# Patient Record
Sex: Male | Born: 1955 | Race: White | Hispanic: Yes | Marital: Married | Smoking: Never smoker
Health system: Southern US, Community
[De-identification: ages and names within clinical notes are randomized; demographics above are authoritative.]

## PROBLEM LIST (undated history)

## (undated) DIAGNOSIS — M1990 Unspecified osteoarthritis, unspecified site: Secondary | ICD-10-CM

## (undated) DIAGNOSIS — K5792 Diverticulitis of intestine, part unspecified, without perforation or abscess without bleeding: Secondary | ICD-10-CM

## (undated) DIAGNOSIS — K5790 Diverticulosis of intestine, part unspecified, without perforation or abscess without bleeding: Secondary | ICD-10-CM

## (undated) DIAGNOSIS — C61 Malignant neoplasm of prostate: Secondary | ICD-10-CM

## (undated) DIAGNOSIS — E785 Hyperlipidemia, unspecified: Secondary | ICD-10-CM

## (undated) HISTORY — DX: Unspecified osteoarthritis, unspecified site: M19.90

## (undated) HISTORY — DX: Hyperlipidemia, unspecified: E78.5

## (undated) HISTORY — PX: EYE SURGERY: SHX253

## (undated) HISTORY — DX: Diverticulitis of intestine, part unspecified, without perforation or abscess without bleeding: K57.92

## (undated) HISTORY — DX: Malignant neoplasm of prostate: C61

## (undated) HISTORY — DX: Diverticulosis of intestine, part unspecified, without perforation or abscess without bleeding: K57.90

## (undated) HISTORY — PX: TONSILLECTOMY: SUR1361

## (undated) HISTORY — PX: REFRACTIVE SURGERY: SHX103

---

## 2007-05-18 HISTORY — PX: CHOLECYSTECTOMY: SHX55

## 2009-05-17 HISTORY — PX: PROSTATECTOMY: SHX69

## 2017-05-17 DIAGNOSIS — I209 Angina pectoris, unspecified: Secondary | ICD-10-CM

## 2017-05-17 HISTORY — DX: Angina pectoris, unspecified: I20.9

## 2019-08-01 ENCOUNTER — Encounter: Payer: Self-pay | Admitting: Family Medicine

## 2019-08-01 ENCOUNTER — Other Ambulatory Visit: Payer: Self-pay

## 2019-08-01 ENCOUNTER — Ambulatory Visit (INDEPENDENT_AMBULATORY_CARE_PROVIDER_SITE_OTHER): Payer: BC Managed Care – PPO | Admitting: Family Medicine

## 2019-08-01 VITALS — BP 138/70 | HR 46 | Temp 98.0°F | Ht 70.0 in | Wt 189.4 lb

## 2019-08-01 DIAGNOSIS — M7521 Bicipital tendinitis, right shoulder: Secondary | ICD-10-CM

## 2019-08-01 DIAGNOSIS — M1990 Unspecified osteoarthritis, unspecified site: Secondary | ICD-10-CM | POA: Insufficient documentation

## 2019-08-01 DIAGNOSIS — C61 Malignant neoplasm of prostate: Secondary | ICD-10-CM | POA: Insufficient documentation

## 2019-08-01 DIAGNOSIS — E785 Hyperlipidemia, unspecified: Secondary | ICD-10-CM | POA: Insufficient documentation

## 2019-08-01 LAB — PSA: PSA: 0.05 ng/mL — ABNORMAL LOW (ref 0.10–4.00)

## 2019-08-01 LAB — TSH: TSH: 1.33 u[IU]/mL (ref 0.35–4.50)

## 2019-08-01 LAB — CBC
HCT: 39.9 % (ref 39.0–52.0)
Hemoglobin: 13.1 g/dL (ref 13.0–17.0)
MCHC: 32.9 g/dL (ref 30.0–36.0)
MCV: 84.8 fl (ref 78.0–100.0)
Platelets: 266 10*3/uL (ref 150.0–400.0)
RBC: 4.71 Mil/uL (ref 4.22–5.81)
RDW: 13.8 % (ref 11.5–15.5)
WBC: 7.5 10*3/uL (ref 4.0–10.5)

## 2019-08-01 LAB — COMPREHENSIVE METABOLIC PANEL
ALT: 11 U/L (ref 0–53)
AST: 16 U/L (ref 0–37)
Albumin: 4.3 g/dL (ref 3.5–5.2)
Alkaline Phosphatase: 96 U/L (ref 39–117)
BUN: 10 mg/dL (ref 6–23)
CO2: 27 mEq/L (ref 19–32)
Calcium: 9.7 mg/dL (ref 8.4–10.5)
Chloride: 105 mEq/L (ref 96–112)
Creatinine, Ser: 0.79 mg/dL (ref 0.40–1.50)
GFR: 98.65 mL/min (ref >60.00)
Glucose, Bld: 99 mg/dL (ref 70–99)
Potassium: 4.6 mEq/L (ref 3.5–5.1)
Sodium: 139 mEq/L (ref 135–145)
Total Bilirubin: 0.3 mg/dL (ref 0.2–1.2)
Total Protein: 7.3 g/dL (ref 6.0–8.3)

## 2019-08-01 LAB — LIPID PANEL
Cholesterol: 223 mg/dL — ABNORMAL HIGH (ref 0–200)
HDL: 48.2 mg/dL (ref >39.00)
LDL Cholesterol: 144 mg/dL — ABNORMAL HIGH (ref 0–99)
NonHDL: 174.66
Total CHOL/HDL Ratio: 5
Triglycerides: 151 mg/dL — ABNORMAL HIGH (ref 0.0–149.0)
VLDL: 30.2 mg/dL (ref 0.0–40.0)

## 2019-08-01 MED ORDER — DICLOFENAC SODIUM 75 MG PO TBEC: 75.0000 mg | DELAYED_RELEASE_TABLET | Freq: Two times a day (BID) | ORAL | 0 refills | Status: DC

## 2019-08-01 NOTE — Assessment & Plan Note (Signed)
Check lipid panel, CBC, C met, TSH.

## 2019-08-01 NOTE — Progress Notes (Signed)
Jackson [name] is a 64 y.o. male who presents today for an office visit.  He is establishing care as a new patient today.  Assessment/Plan:  New/Acute Problems: Right shoulder pain Consistent with bicep tendinitis.  Discussed home exercises and handout was given.  Recommended ice multiple times daily.  Will start diclofenac 75 mg twice daily for the next 1 to 2 weeks.  Discussed reasons return to care.  Chronic Problems Addressed Today: No problem-specific Assessment & Plan notes found for this encounter.     Subjective:  HPI:  Patient has had right shoulder pain for the past several months.  He is currently remodeling his house and thinks he may have overused it.  He is concerned about a possible tendon strain.  He has used Tylenol and over-the-counter patches which is helped.  Symptoms seem to be improving modestly.  Pain is worse with certain motions.  He also has he will of chronic conditions.  He has a history of high cholesterol has been on cholesterol meds in the past.  Is not currently on anything.  He has a history of prostate cancer and had a prostatectomy in 2011.  Last PSA was checked a couple of years ago which was normal.  He also has bilateral hand osteoarthritis.  PMH:  The following were reviewed and entered/updated in epic: Past Medical History:  Diagnosis Date  . Diverticulosis    Patient Active Problem List   Diagnosis Date Noted  . Dyslipidemia 08/01/2019  . Prostate cancer [facility]) s/p prostatectomy 08/01/2019  . Osteoarthritis 08/01/2019   Past Surgical History:  Procedure Laterality Date  . CHOLECYSTECTOMY    . PROSTATECTOMY  2011    Family History  Problem Relation Age of Onset  . Lung cancer Mother   . Diabetes Brother     Medications- reviewed and updated Current Outpatient Medications  Medication Sig Dispense Refill  . diclofenac (VOLTAREN) 75 MG EC tablet Take 1 tablet (75 mg total) by mouth 2 (two) times daily. 30 tablet 0   No current  facility-administered medications for this visit.    Allergies-reviewed and updated Not on File  Social History   Socioeconomic History  . Marital status: Married    Spouse name: Not on file  . Number of children: Not on file  . Years of education: Not on file  . Highest education level: Not on file  Occupational History  . Not on file  Tobacco Use  . Smoking status: Not on file  Substance and Sexual Activity  . Alcohol use: Not on file  . Drug use: Not on file  . Sexual activity: Not on file  Other Topics Concern  . Not on file  Social History Narrative  . Not on file   Social Determinants of Health   Financial Resource Strain:   . Difficulty of Paying Living Expenses:   Food Insecurity:   . Worried About Charity fundraiser in the Last Year:   . Arboriculturist in the Last Year:   Transportation Needs:   . Film/video editor (Medical):   Marland Kitchen Lack of Transportation (Non-Medical):   Physical Activity:   . Days of Exercise per Week:   . Minutes of Exercise per Session:   Stress:   . Feeling of Stress :   Social Connections:   . Frequency of Communication with Friends and Family:   . Frequency of Social Gatherings with Friends and Family:   . Attends Religious Services:   .  Active Member of Clubs or Organizations:   . Attends Archivist Meetings:   Marland Kitchen Marital Status:           Objective:  Physical Exam: BP 138/70   Pulse (!) 46   Temp 98 F (36.7 C) (Temporal)   Ht 5\' 10"  (1.778 m)   Wt 189 lb 6.4 oz (85.9 kg)   SpO2 99%   BMI 27.18 kg/m   Gen: No acute distress, resting comfortably CV: Regular rate and rhythm with no murmurs appreciated Pulm: Normal work of breathing, clear to auscultation bilaterally with no crackles, wheezes, or rhonchi MSK: Right shoulder without deformity.  Biceps tendon palpated with tenderness.  Mild pain with external rotation.  No pain with internal rotation or supraspinatus testing Neuro: Grossly normal, moves  all extremities Psych: Normal affect and thought content      Jackson [name] M. Jerline Pain, MD 08/01/2019 8:03 AM

## 2019-08-01 NOTE — Assessment & Plan Note (Signed)
Should have some improvement with diclofenac.

## 2019-08-01 NOTE — Patient Instructions (Addendum)
It was very nice to see you today!  You have biceps tendonitis.  Please work on the exercises and use ice.  Start the diclofenac.  Take 1 pill twice daily for the next 1 to 2 weeks.  We will check blood work today.  Come back to see me in 1 year for your next checkup with blood work, or sooner if needed.  Take care, Dr Jerline Pain  Please try these tips to maintain a healthy lifestyle:   Eat at least 3 REAL meals and 1-2 snacks per day.  Aim for no more than 5 hours between eating.  If you eat breakfast, please do so within one hour of getting up.    Each meal should contain half fruits/vegetables, one quarter protein, and one quarter carbs (no bigger than a computer mouse)   Cut down on sweet beverages. This includes juice, soda, and sweet tea.     Drink at least 1 glass of water with each meal and aim for at least 8 glasses per day   Exercise at least 150 minutes every week.

## 2019-08-01 NOTE — Assessment & Plan Note (Signed)
Check PSA. ?

## 2019-08-02 NOTE — Progress Notes (Signed)
Please inform patient of the following:  Cholesterol numbers are a bit elevated but everything else is NORMAL. Recommend starting lipitor 40mg  daily to improve cholesterol numbers and lower risk of heart attack and stroke. Please send in for patient if he is willing to start. Regardless, he should continue to work on diet and exercise and we can recheck in a year.  Jackson [name]. Jerline Pain, MD 08/02/2019 1:05 PM

## 2019-08-21 ENCOUNTER — Encounter: Payer: Self-pay | Admitting: Family Medicine

## 2019-08-22 ENCOUNTER — Other Ambulatory Visit: Payer: Self-pay | Admitting: *Deleted

## 2019-08-22 DIAGNOSIS — E785 Hyperlipidemia, unspecified: Secondary | ICD-10-CM

## 2019-08-22 MED ORDER — ATORVASTATIN CALCIUM 40 MG PO TABS: 40.0000 mg | ORAL_TABLET | Freq: Every day | ORAL | 3 refills | Status: DC

## 2019-08-22 NOTE — Telephone Encounter (Signed)
Left message to return call to our office at their convenience.  

## 2019-10-08 ENCOUNTER — Telehealth: Payer: BC Managed Care – PPO | Admitting: Family

## 2019-10-08 ENCOUNTER — Encounter: Payer: Self-pay | Admitting: Family Medicine

## 2019-10-08 ENCOUNTER — Other Ambulatory Visit: Payer: Self-pay | Admitting: *Deleted

## 2019-10-08 DIAGNOSIS — R197 Diarrhea, unspecified: Secondary | ICD-10-CM

## 2019-10-08 DIAGNOSIS — K625 Hemorrhage of anus and rectum: Secondary | ICD-10-CM

## 2019-10-08 NOTE — Progress Notes (Signed)
Based on what you shared with me, I feel your condition warrants further evaluation and I recommend that you be seen for a face to face office visit.  Given your symptoms you need to be seen face to face to rule out a more serious infection.   NOTE: If you entered your credit card information for this eVisit, you will not be charged. You may see a "hold" on your card for the $35 but that hold will drop off and you will not have a charge processed.   If you are having a true medical emergency please call 911.      For an urgent face to face visit, [place] has five urgent care centers for your convenience:      NEW:  [facility] [place] at  Get Driving Directions [id number] [place], [place] [address] . 10 am - 6pm Monday - Friday    [facility]) Get Driving Directions [id number] [address] [place], [place] [address] . 10 am to 8 pm Monday-Friday . 12 pm to 8 pm [facility] at [facility] Get Driving Directions [id number] [place], [place], [place] [address] . 8 am to 8 pm Monday-Friday . 9 am to 6 pm Saturday . 11 am to 6 pm Sunday     [facility] at [facility] Get Driving Directions  [id number] [address], [place] [address] . 8 am to 8 pm Monday-Friday . 8 am to 4 pm [facility] at [place] Get Driving Directions [id number] [place], [place] [address] . 12 pm to 6 pm Monday-Friday      Your e-visit answers were reviewed by a board certified advanced clinical practitioner to complete your personal care plan.  Thank you for using e-Visits.

## 2019-10-08 NOTE — Telephone Encounter (Signed)
Referral placed.

## 2019-10-09 ENCOUNTER — Encounter: Payer: Self-pay | Admitting: Family Medicine

## 2019-10-09 ENCOUNTER — Ambulatory Visit: Payer: BC Managed Care – PPO | Admitting: Family Medicine

## 2019-10-09 ENCOUNTER — Ambulatory Visit (INDEPENDENT_AMBULATORY_CARE_PROVIDER_SITE_OTHER): Payer: BC Managed Care – PPO | Admitting: Family Medicine

## 2019-10-09 ENCOUNTER — Other Ambulatory Visit: Payer: Self-pay

## 2019-10-09 VITALS — BP 102/70 | HR 71 | Temp 98.6°F | Ht 70.0 in | Wt 181.4 lb

## 2019-10-09 DIAGNOSIS — K5790 Diverticulosis of intestine, part unspecified, without perforation or abscess without bleeding: Secondary | ICD-10-CM | POA: Diagnosis not present

## 2019-10-09 DIAGNOSIS — K5792 Diverticulitis of intestine, part unspecified, without perforation or abscess without bleeding: Secondary | ICD-10-CM

## 2019-10-09 MED ORDER — METRONIDAZOLE 500 MG PO TABS: 500.0000 mg | ORAL_TABLET | Freq: Three times a day (TID) | ORAL | 0 refills | Status: DC

## 2019-10-09 MED ORDER — CIPROFLOXACIN HCL 500 MG PO TABS: 500.0000 mg | ORAL_TABLET | Freq: Two times a day (BID) | ORAL | 0 refills | Status: DC

## 2019-10-09 NOTE — Progress Notes (Signed)
   Jackson [name] is a 64 y.o. male who presents today for an office visit.  Assessment/Plan:  Chronic Problems Addressed Today: Diverticulosis Has diverticulitis flare today.  We will start Cipro and Flagyl as this is worked well for him in the past.  His recent rectal bleeding likely due to diverticulosis however has referral to GI pending.  Encourage good oral hydration.  Discussed reasons to return to care.     Subjective:  HPI:  Patient with flareup of diverticulitis for the past few months.  Got much worse yesterday.  He took leftover Cipro and Flagyl from previous flareup.  Had some improvement.  Pain is better today.  Was initially located in left lower quadrant.  He has been having more frequent loose stools as well.  Over last few months he has additionally had more rectal bleeding.  He would like to be referred to GI.  Had a little nausea yesterday but is better today.       Objective:  Physical Exam: BP 102/70 (BP Location: Left Arm, Patient Position: Sitting, Cuff Size: Normal)   Pulse 71   Temp 98.6 F (37 C) (Temporal)   Ht 5\' 10"  (1.778 m)   Wt 181 lb 6.4 oz (82.3 kg)   SpO2 98%   BMI 26.03 kg/m   Gen: No acute distress, resting comfortably CV: Regular rate and rhythm with no murmurs appreciated Pulm: Normal work of breathing, clear to auscultation bilaterally with no crackles, wheezes, or rhonchi GI: Soft, nontender, nondistended. Neuro: Grossly normal, moves all extremities Psych: Normal affect and thought content      Jackson [name] M. Jerline Pain, MD 10/09/2019 2:28 PM

## 2019-10-09 NOTE — Assessment & Plan Note (Signed)
Has diverticulitis flare today.  We will start Cipro and Flagyl as this is worked well for him in the past.  His recent rectal bleeding likely due to diverticulosis however has referral to GI pending.  Encourage good oral hydration.  Discussed reasons to return to care.

## 2019-10-09 NOTE — Patient Instructions (Signed)
It was very nice to see you today!  I will refill your medications today.  Please take at least for 1 week.  If you are not having any improvement after a week or if your symptoms are worsening please let me know.  You should hear from the GI office soon for your referral.  I will see you back next year for your physical, or sooner if needed.  Take care, Dr Jerline Pain  Please try these tips to maintain a healthy lifestyle:   Eat at least 3 REAL meals and 1-2 snacks per day.  Aim for no more than 5 hours between eating.  If you eat breakfast, please do so within one hour of getting up.    Each meal should contain half fruits/vegetables, one quarter protein, and one quarter carbs (no bigger than a computer mouse)   Cut down on sweet beverages. This includes juice, soda, and sweet tea.     Drink at least 1 glass of water with each meal and aim for at least 8 glasses per day   Exercise at least 150 minutes every week.

## 2019-10-11 ENCOUNTER — Encounter: Payer: Self-pay | Admitting: Nurse Practitioner

## 2019-11-02 NOTE — Progress Notes (Signed)
11/06/2019 Knute Mazzuca [id number] 1956-01-04   CHIEF COMPLAINT:  LLQ pain, rectal bleeding   HISTORY OF PRESENT ILLNESS:  Ervey Fallin is a 64 year old male with a past medical history of arthritis, diverticulitis and prostate cancer s/p prostatectomy 2011 (no radiation or chemo). S/P cholecystectomy 2009.  He presents to our office today as referred by Dr. Dimas Chyle for further evaluation for recurrent diverticulitis and rectal bleeding. He was initially diagnosed with diverticulitis 20 years ago when living in [place]. Since then, he reported having flares of diverticulitis every 1 1/2 years. Never had a CTAP to confirm his diagnosis of diverticulitis. His most recent episode of diverticulitis occurred 10/09/2019. At that time, he was evaluated by his PCP Dr. Dimas Chyle.  He also reported having rectal bleeding for 2 months. He reported passing a hard stool in the morning followed by 3 to 4 mores formed BMs. He noticed bright red blood on the stool and TP with the last BM of the day. He was prescribed Cipro 500 mg  p.o. twice daily and Flagyl 5 mg p.o. 3 times daily x  7 days and his rectal bleeding abated and his LLQ pain significantly improved but did not completely resolve. However, he passed a formed BM yesterday without experiencing any LLQ pain. No BM yet today.  He underwent a colonoscopy in 2011 following an episodes of diverticulitis with significant rectal bleeding. No family history of colon cancer. His father had a colon resection secondary to diverticular disease which required a colostomy. He died post op after having the colostomy reversed in his 11's.    CBC Latest Ref Rng & Units 08/01/2019  WBC 4.0 - 10.5 K/uL 7.5  Hemoglobin 13.0 - 17.0 g/dL 13.1  Hematocrit 39 - 52 % 39.9  Platelets 150 - 400 K/uL 266.0    CMP Latest Ref Rng & Units 08/01/2019  Glucose 70 - 99 mg/dL 99  BUN 6 - 23 mg/dL 10  Creatinine 0.40 - 1.50 mg/dL 0.79  Sodium 135 - 145 mEq/L 139  Potassium  3.5 - 5.1 mEq/L 4.6  Chloride 96 - 112 mEq/L 105  CO2 19 - 32 mEq/L 27  Calcium 8.4 - 10.5 mg/dL 9.7  Total Protein 6.0 - 8.3 g/dL 7.3  Total Bilirubin 0.2 - 1.2 mg/dL 0.3  Alkaline Phos 39 - 117 U/L 96  AST 0 - 37 U/L 16  ALT 0 - 53 U/L 11     Past Medical History:  Diagnosis Date  . Arthritis   . Diverticulitis   . Diverticulosis   . HLD (hyperlipidemia)   . Prostate cancer [facility])    Past Surgical History:  Procedure Laterality Date  . CHOLECYSTECTOMY  05/18/2007  . PROSTATECTOMY  2011  . REFRACTIVE SURGERY Bilateral   . TONSILLECTOMY      Social History:  He is married. Retired. He has 2 sons. Nonsmoker. He drinks four  8 ounce cans of Coke daily. No alcohol. No drug use.   Family History: Mother died age 40 lung cancer and depression. Father with history of diverticulosis, surgery with colostomy, reversal surgery died on his 87's from post op  complications.   Brother age 41 with history of MI, overweight and DM II. Sister age  6 with history of drug and alcohol abuse. Maternal grandfather ? Cancer.   No Known Allergies    Outpatient Encounter Medications as of 11/06/2019  Medication Sig  . acetaminophen (TYLENOL) 500 MG tablet Take 1,000 mg by mouth  every 6 (six) hours as needed.  Marland Kitchen aspirin EC 81 MG tablet Take 81 mg by mouth daily.  Marland Kitchen atorvastatin (LIPITOR) 40 MG tablet Take 1 tablet (40 mg total) by mouth daily.  Nyoka Cowden Tea, Camellia sinensis, (GREEN TEA PO) Take by mouth.  . [DISCONTINUED] ciprofloxacin (CIPRO) 500 MG tablet Take 1 tablet (500 mg total) by mouth 2 (two) times daily.  . [DISCONTINUED] diclofenac (VOLTAREN) 75 MG EC tablet Take 1 tablet (75 mg total) by mouth 2 (two) times daily.  . [DISCONTINUED] metroNIDAZOLE (FLAGYL) 500 MG tablet Take 1 tablet (500 mg total) by mouth 3 (three) times daily.   No facility-administered encounter medications on file as of 11/06/2019.     REVIEW OF SYSTEMS:   Gen: Intentionally lost weight. Denies fever, sweats  or chills. CV: Denies chest pain, palpitations or edema. Resp: Denies cough, shortness of breath of hemoptysis.  GI: See HPI.  GU : Denies urinary burning, blood in urine, increased urinary frequency or incontinence. MS: Denies joint pain, muscles aches or weakness. Derm: Denies rash, itchiness, skin lesions or unhealing ulcers. Psych: Denies depression, anxiety, memory loss, suicidal ideation and confusion. Heme: Denies bruising, bleeding. Neuro:  Denies headaches, dizziness or paresthesias. Endo:  Denies any problems with DM, thyroid or adrenal function.  PHYSICAL EXAM: BP 128/78 (BP Location: Left Arm, Patient Position: Sitting, Cuff Size: Normal)   Pulse 64   Ht 5' 9.25" (1.759 m) Comment: height measured without shoes  Wt 181 lb 6 oz (82.3 kg)   BMI 26.59 kg/m  General: Well developed 64 year old male in no acute distress. Head: Normocephalic and atraumatic. Eyes:  Sclerae non-icteric, conjunctive pink. Ears: Normal auditory acuity. Mouth: Dentition intact. No ulcers or lesions.  Neck: Supple, no lymphadenopathy or thyromegaly.  Lungs: Clear bilaterally to auscultation without wheezes, crackles or rhonchi. Heart: Regular rate and rhythm. No murmur, rub or gallop appreciated.  Abdomen: Soft, non distended. Mild LLQ tenderness without rebound or guarding.  LLQ thickened without discrete mass. No hepatosplenomegaly. Normoactive bowel sounds x 4 quadrants.  Rectal: Patient declined rectal exam today.  Musculoskeletal: Symmetrical with no gross deformities. Skin: Warm and dry. No rash or lesions on visible extremities. Extremities: No edema. Neurological: Alert oriented x 4, no focal deficits.  Psychological:  Alert and cooperative. Normal mood and affect.  ASSESSMENT AND PLAN:  19. 64 year old male with a history of recurrent LLQ pain treated for diverticulitis, last treated with Cipro and Flagyl by his PCP 10/09/2019. Mild LLQ tenderness with thickened left lower colon palpated  on exam today.  -CBC, CMP and CRP  -Abdominal/pelvic CT with contrast -Colonoscopy benefits and risks discussed including risk with sedation, risk of bleeding, perforation and infection. Colonoscopy to be scheduled in 4 to 6 weeks if CTAP does not show active diverticulitis.  -Further follow up to be determined after CTAP and colonoscopy completed  -Miralax Q HS PRN, avoid constipation/straining   2. Rectal Bleeding -See plan in # 1 -Patient to call our office if rectal bleeding recurs.   3. History of Prostate s/p prostatectomy in 2011.        CC:  Vivi Barrack, MD

## 2019-11-06 ENCOUNTER — Other Ambulatory Visit (INDEPENDENT_AMBULATORY_CARE_PROVIDER_SITE_OTHER): Payer: BC Managed Care – PPO

## 2019-11-06 ENCOUNTER — Encounter: Payer: Self-pay | Admitting: Nurse Practitioner

## 2019-11-06 ENCOUNTER — Ambulatory Visit (INDEPENDENT_AMBULATORY_CARE_PROVIDER_SITE_OTHER): Payer: BC Managed Care – PPO | Admitting: Nurse Practitioner

## 2019-11-06 VITALS — BP 128/78 | HR 64 | Ht 69.25 in | Wt 181.4 lb

## 2019-11-06 DIAGNOSIS — K625 Hemorrhage of anus and rectum: Secondary | ICD-10-CM

## 2019-11-06 DIAGNOSIS — K5900 Constipation, unspecified: Secondary | ICD-10-CM | POA: Diagnosis not present

## 2019-11-06 DIAGNOSIS — K5732 Diverticulitis of large intestine without perforation or abscess without bleeding: Secondary | ICD-10-CM

## 2019-11-06 DIAGNOSIS — R1032 Left lower quadrant pain: Secondary | ICD-10-CM | POA: Diagnosis not present

## 2019-11-06 LAB — CBC WITH DIFFERENTIAL/PLATELET
Basophils Absolute: 0.1 10*3/uL (ref 0.0–0.1)
Basophils Relative: 1.1 % (ref 0.0–3.0)
Eosinophils Absolute: 0.2 10*3/uL (ref 0.0–0.7)
Eosinophils Relative: 2.6 % (ref 0.0–5.0)
HCT: 37.1 % — ABNORMAL LOW (ref 39.0–52.0)
Hemoglobin: 12.3 g/dL — ABNORMAL LOW (ref 13.0–17.0)
Lymphocytes Relative: 37.4 % (ref 12.0–46.0)
Lymphs Abs: 2.7 10*3/uL (ref 0.7–4.0)
MCHC: 33.2 g/dL (ref 30.0–36.0)
MCV: 85 fl (ref 78.0–100.0)
Monocytes Absolute: 0.4 10*3/uL (ref 0.1–1.0)
Monocytes Relative: 5.9 % (ref 3.0–12.0)
Neutro Abs: 3.8 10*3/uL (ref 1.4–7.7)
Neutrophils Relative %: 53 % (ref 43.0–77.0)
Platelets: 254 10*3/uL (ref 150.0–400.0)
RBC: 4.37 Mil/uL (ref 4.22–5.81)
RDW: 15.3 % (ref 11.5–15.5)
WBC: 7.1 10*3/uL (ref 4.0–10.5)

## 2019-11-06 LAB — COMPREHENSIVE METABOLIC PANEL
ALT: 15 U/L (ref 0–53)
AST: 18 U/L (ref 0–37)
Albumin: 4.6 g/dL (ref 3.5–5.2)
Alkaline Phosphatase: 86 U/L (ref 39–117)
BUN: 12 mg/dL (ref 6–23)
CO2: 29 mEq/L (ref 19–32)
Calcium: 9.7 mg/dL (ref 8.4–10.5)
Chloride: 105 mEq/L (ref 96–112)
Creatinine, Ser: 0.78 mg/dL (ref 0.40–1.50)
GFR: 100.02 mL/min (ref >60.00)
Glucose, Bld: 100 mg/dL — ABNORMAL HIGH (ref 70–99)
Potassium: 4 mEq/L (ref 3.5–5.1)
Sodium: 139 mEq/L (ref 135–145)
Total Bilirubin: 0.3 mg/dL (ref 0.2–1.2)
Total Protein: 7.6 g/dL (ref 6.0–8.3)

## 2019-11-06 LAB — C-REACTIVE PROTEIN: CRP: <1 mg/dL (ref 0.5–20.0)

## 2019-11-06 MED ORDER — CLENPIQ 10-3.5-12 MG-GM -GM/160ML PO SOLN
1.0000 | ORAL | 0 refills | Status: DC
Start: 2019-11-06 — End: 2019-11-30

## 2019-11-06 NOTE — Patient Instructions (Addendum)
If you are age 65 or older, your body mass index should be between 23-30. Your Body mass index is 26.59 kg/m. If this is out of the aforementioned range listed, please consider follow up with your Primary Care Provider.  If you are age 61 or younger, your body mass index should be between 19-25. Your Body mass index is 26.59 kg/m. If this is out of the aformentioned range listed, please consider follow up with your Primary Care Provider.   You have been scheduled for a CT scan of the abdomen and pelvis at  [place] are scheduled on 11/14/2019 at 2pm. You should arrive 15 minutes prior to your appointment time for registration. Please follow the written instructions below on the day of your exam:  WARNING: IF YOU ARE ALLERGIC TO IODINE/X-RAY DYE, PLEASE NOTIFY RADIOLOGY IMMEDIATELY AT [phone]! YOU WILL BE GIVEN A 13 HOUR PREMEDICATION PREP.  1) Do not eat or drink anything after 10:00am (4 hours prior to your test) 2) You have been given 2 bottles of oral contrast to drink. The solution may taste better if refrigerated, but do NOT add ice or any other liquid to this solution. Shake well before drinking.    Drink 1 bottle of contrast @ 12:00pm (2 hours prior to your exam)  Drink 1 bottle of contrast @ 1:00pm (1 hour prior to your exam)  You may take any medications as prescribed with a small amount of water, if necessary. If you take any of the following medications: METFORMIN, GLUCOPHAGE, GLUCOVANCE, AVANDAMET, RIOMET, FORTAMET, [place] MET, JANUMET, GLUMETZA or METAGLIP, you MAY be asked to HOLD this medication 48 hours AFTER the exam.  The purpose of you drinking the oral contrast is to aid in the visualization of your intestinal tract. The contrast solution may cause some diarrhea. Depending on your individual set of symptoms, you may also receive an intravenous injection of x-ray contrast/dye. Plan on being at [facility] for 30 minutes or longer,  depending on the type of exam you are having performed.  This test typically takes 30-45 minutes to complete.  If you have any questions regarding your exam or if you need to reschedule, you may call the CT department at [phone] between the hours of 8:00 am and 5:00 pm, Monday-Friday.  ________________________________________________________________________ Your provider has requested that you go to the basement level for lab work before leaving today. Press "B" on the elevator. The lab is located at the first door on the left as you exit the elevator.  We have sent the following medications to your pharmacy for you to pick up at your convenience: Clenpiq-colon prep  Take Miralax 1 capful mixed in 8 ounces of water at bed time for constipation as tolerated.  Due to recent changes in healthcare laws, you may see the results of your imaging and laboratory studies on MyChart before your provider has had a chance to review them.  We understand that in some cases there may be results that are confusing or concerning to you. Not all laboratory results come back in the same time frame and the provider may be waiting for multiple results in order to interpret others.  Please give [place] 48 hours in order for your provider to thoroughly review all the results before contacting the office for clarification of your results.   Thank you for choosing [facility] Noralyn Pick, CRNP

## 2019-11-07 NOTE — Progress Notes (Signed)
Agree with the assessment and plan as outlined by Colleen Kennedy-Smith, NP.   Esta Carmon, DO, FACG [facility]   

## 2019-11-14 ENCOUNTER — Encounter (HOSPITAL_COMMUNITY): Payer: Self-pay

## 2019-11-14 ENCOUNTER — Ambulatory Visit (HOSPITAL_COMMUNITY)
Admission: RE | Admit: 2019-11-14 | Discharge: 2019-11-14 | Disposition: A | Discharge status: Home / Self Care | Payer: BC Managed Care – PPO | Source: Ambulatory Visit | Attending: Nurse Practitioner | Admitting: Nurse Practitioner

## 2019-11-14 ENCOUNTER — Other Ambulatory Visit: Payer: Self-pay

## 2019-11-14 DIAGNOSIS — R1032 Left lower quadrant pain: Secondary | ICD-10-CM | POA: Diagnosis not present

## 2019-11-14 DIAGNOSIS — K625 Hemorrhage of anus and rectum: Secondary | ICD-10-CM | POA: Insufficient documentation

## 2019-11-14 DIAGNOSIS — K5900 Constipation, unspecified: Secondary | ICD-10-CM | POA: Diagnosis present

## 2019-11-14 DIAGNOSIS — K5732 Diverticulitis of large intestine without perforation or abscess without bleeding: Secondary | ICD-10-CM | POA: Diagnosis present

## 2019-11-14 MED ORDER — IOHEXOL 300 MG/ML  SOLN
100.0000 mL | Freq: Once | INTRAMUSCULAR | Status: AC | PRN
  Administered 2019-11-14: 100 mL via INTRAVENOUS

## 2019-11-14 MED ORDER — SODIUM CHLORIDE (PF) 0.9 % IJ SOLN
INTRAMUSCULAR | Status: AC
  Filled 2019-11-14: qty 50

## 2019-11-15 ENCOUNTER — Other Ambulatory Visit: Payer: Self-pay | Admitting: Nurse Practitioner

## 2019-11-15 ENCOUNTER — Telehealth: Payer: Self-pay | Admitting: Nurse Practitioner

## 2019-11-15 MED ORDER — METRONIDAZOLE 500 MG PO TABS
500.0000 mg | ORAL_TABLET | Freq: Two times a day (BID) | ORAL | 0 refills | Status: AC
Start: 2019-11-15 — End: 2019-11-29

## 2019-11-15 MED ORDER — CIPROFLOXACIN HCL 500 MG PO TABS
500.0000 mg | ORAL_TABLET | Freq: Two times a day (BID) | ORAL | 0 refills | Status: AC
Start: 2019-11-15 — End: 2019-11-29

## 2019-11-15 NOTE — Telephone Encounter (Signed)
Provider is aware. Thanked [facility] for bringing this to our attention.

## 2019-11-15 NOTE — Telephone Encounter (Signed)
Jackson [name] at [facility] imaging called to provide imaging results. She stated to please call them back even if we received the report.

## 2019-11-26 NOTE — Telephone Encounter (Signed)
Beth looks like I have a 3pm on 7/14, if still open see if patient can take that spot. Thx

## 2019-11-30 ENCOUNTER — Ambulatory Visit (INDEPENDENT_AMBULATORY_CARE_PROVIDER_SITE_OTHER): Payer: BC Managed Care – PPO | Admitting: Nurse Practitioner

## 2019-11-30 ENCOUNTER — Encounter: Payer: Self-pay | Admitting: Nurse Practitioner

## 2019-11-30 ENCOUNTER — Telehealth: Payer: Self-pay | Admitting: Nurse Practitioner

## 2019-11-30 DIAGNOSIS — K5720 Diverticulitis of large intestine with perforation and abscess without bleeding: Secondary | ICD-10-CM | POA: Insufficient documentation

## 2019-11-30 NOTE — Telephone Encounter (Signed)
Jackson [name], can you contact the patient on Monday 7/19 and let him know I would like him to have repeat CBC with iron, iron saturation, TIBC, ferritin, B12 and folate levels done in 2 weeks. Pls provide him with the lab order.

## 2019-11-30 NOTE — Progress Notes (Signed)
11/30/2019 Jackson [name] [id number] 03-03-1956   Chief Complaint: Follow up diverticulitis with perforation   History of Present Illness: Jackson [name] is a 64 year old male with a past medical history of arthritis, diverticulitis and prostate cancer s/p prostatectomy 2011 (no radiation or chemo). S/P cholecystectomy 2009.  Initially saw the patient in office on 11/06/2019 for further evaluation regarding recurrent diverticulitis. He was initially diagnosed with diverticulitis 20 years ago when living in [place]. Since then, he reported having flares of diverticulitis every 1 1/2 years. Never had a CTAP to confirm his diagnosis of diverticulitis.  He developed left lower quadrant abdominal pain and he was treated for presumed diverticulitis by his PCP 10/09/2019.  At that time, He was prescribed Cipro 500 mg  p.o. twice daily and Flagyl 5 mg p.o. 3 times daily x  7 days and his rectal bleeding abated and his LLQ pain significantly improved but did not completely resolve.  When I saw him in the office on 6/22 he had mild left lower quadrant tenderness and the colon felt thickened.  He underwent an abdominal/pelvic CT scan 11/15/2019 which identified a 2.0 x 2.3 x 2.1 cm chronic abscess with a probable contained perforation to the proximal to mid sigmoid colon most likely sequelae of prior diverticulitis. See detailed CT report below.  He was prescribed Cipro 500 mg 1 p.o. twice daily and Flagyl 500 mg 1 p.o. twice daily for 14 days.  His last dose of these antibiotics were taken yesterday.  He presents today for further evaluation.  His left lower quadrant abdominal pain resolved 3 to 4 days after taking the Cipro and Flagyl.  He denies having any further lower abdominal pain.  He is passing a normal brown formed bowel movement easily once daily.  He is taking a stool softener daily.  No recent rectal bleeding.  He is urinating without difficulty.  His appetite is good.  His weight is stable.  No other  complaints at this time.  CBC Latest Ref Rng & Units 11/06/2019 08/01/2019  WBC 4.0 - 10.5 K/uL 7.1 7.5  Hemoglobin 13.0 - 17.0 g/dL 12.3(L) 13.1  Hematocrit 39 - 52 % 37.1(L) 39.9  Platelets 150 - 400 K/uL 254.0 266.0    CMP Latest Ref Rng & Units 11/06/2019 08/01/2019  Glucose 70 - 99 mg/dL 100(H) 99  BUN 6 - 23 mg/dL 12 10  Creatinine 0.40 - 1.50 mg/dL 0.78 0.79  Sodium 135 - 145 mEq/L 139 139  Potassium 3.5 - 5.1 mEq/L 4.0 4.6  Chloride 96 - 112 mEq/L 105 105  CO2 19 - 32 mEq/L 29 27  Calcium 8.4 - 10.5 mg/dL 9.7 9.7  Total Protein 6.0 - 8.3 g/dL 7.6 7.3  Total Bilirubin 0.2 - 1.2 mg/dL 0.3 0.3  Alkaline Phos 39 - 117 U/L 86 96  AST 0 - 37 U/L 18 16  ALT 0 - 53 U/L 15 11    Abdominal/pelvic CT scan 11/15/2019: 1. Diffuse diverticular change in the colon, most advanced in the descending and sigmoid segments. There is some subtle edema/inflammation associated with the distal sigmoid segment and immediately cranial to the proximal to mid sigmoid colon is a 2.0 x 2.3 x 2.1 cm irregular soft tissue structure containing several tiny gas locules. Imaging features are most likely sequelae of age-indeterminate diverticulitis. I do not think this is a dominant irregular diverticulum, but is more likely either a chronic abscess pocket or acute focal area of pericolonic inflammation. There is no  fluid in this structure and there is no percutaneously drainable abscess at this time. Additionally, correlation with colorectal cancer screening history recommended. 2. Tiny hypoattenuating liver lesions, too small to characterize, but likely benign. 3. Prostatectomy. 4. Aortic Atherosclerosis    Current Medications, Allergies, Past Medical History, Past Surgical History, Family History and Social History were reviewed in [employer] record.   Physical Exam: BP 116/62   Pulse (!) 57   Ht 5\' 9"  (1.753 m)   Wt 178 lb 8 oz (81 kg)   BMI 26.36 kg/m  General: Well  developed 64 year old male in no acute distress. Head: Normocephalic and atraumatic. Eyes: No scleral icterus. Conjunctiva pink . Lungs: Clear throughout to auscultation. Heart: Regular rate and rhythm, no murmur. Abdomen: Soft, nontender and nondistended. No masses or hepatomegaly. Normal bowel sounds x 4 quadrants.  Rectal: Deferred.  Musculoskeletal: Symmetrical with no gross deformities. Extremities: No edema. Neurological: Alert oriented x 4. No focal deficits.  Psychological: Alert and cooperative. Normal mood and affect  Assessment and Recommendations:  76.  64 year old male with recurrent left-sided diverticulitis. An abdominal/pelvic CT scan 11/15/2019 identified a 2.0 x 2.3 x 2.1 cm chronic abscess with a probable contained perforation to the proximal to mid sigmoid colon most likely sequelae of prior diverticulitis.  Treated with Cipro 500 mg p.o. twice daily and Flagyl 500 mg 1 p.o. twice daily for 14 days.  His left lower quadrant abdominal pain has resolved. -Colonoscopy with Dr. Bryan Lemma was rescheduled to 01/15/2020. -Patient will call our office if his abdominal pain recurs -Patient should carry a prescription for Cipro and Flagyl during times of travel -Stool softener of choice or MiraLAX, avoid constipation/straining -Pus fluids, resume normal diet -Further follow-up to be determined after colonoscopy completed  2. Normocytic anemia -CBC, iron, iron saturation, TIBC, Ferritin, B12 and folate level. -Patient will require EGD in labs show iron deficiency   3. History of prostate cancer.

## 2019-11-30 NOTE — Patient Instructions (Addendum)
If you are age 65 or older, your body mass index should be between 23-30. Your Body mass index is 26.36 kg/m. If this is out of the aforementioned range listed, please consider follow up with your Primary Care Provider.  If you are age 29 or younger, your body mass index should be between 19-25. Your Body mass index is 26.36 kg/m. If this is out of the aformentioned range listed, please consider follow up with your Primary Care Provider.   1. Call our office if you abdominal pain recurs  2. Avoid constipation. Take a stool softener or Miralax once daily as tolerated to avoid constipation/straining.   Changed date for Colonoscopy to 01/15/2020 @ 10:30am. New instructions for procedure sent via my chart.  Due to recent changes in healthcare laws, you may see the results of your imaging and laboratory studies on MyChart before your provider has had a chance to review them.  We understand that in some cases there may be results that are confusing or concerning to you. Not all laboratory results come back in the same time frame and the provider may be waiting for multiple results in order to interpret others.  Please give [place] 48 hours in order for your provider to thoroughly review all the results before contacting the office for clarification of your results.   Thank you for choosing [facility] Noralyn Pick, CRNP

## 2019-12-03 ENCOUNTER — Other Ambulatory Visit: Payer: Self-pay

## 2019-12-03 DIAGNOSIS — D519 Vitamin B12 deficiency anemia, unspecified: Secondary | ICD-10-CM

## 2019-12-03 DIAGNOSIS — D649 Anemia, unspecified: Secondary | ICD-10-CM

## 2019-12-03 NOTE — Telephone Encounter (Signed)
Contacted the patient. He agrees to come to [facility] for non-fasting labs on 12/17/19.

## 2019-12-07 NOTE — Progress Notes (Signed)
Agree with the assessment and plan as outlined by Colleen Kennedy-Smith, NP.   Byrdie Miyazaki, DO, FACG [facility]   

## 2019-12-17 ENCOUNTER — Other Ambulatory Visit (INDEPENDENT_AMBULATORY_CARE_PROVIDER_SITE_OTHER): Payer: BC Managed Care – PPO

## 2019-12-17 DIAGNOSIS — D649 Anemia, unspecified: Secondary | ICD-10-CM | POA: Diagnosis not present

## 2019-12-17 DIAGNOSIS — D519 Vitamin B12 deficiency anemia, unspecified: Secondary | ICD-10-CM | POA: Diagnosis not present

## 2019-12-17 LAB — CBC WITH DIFFERENTIAL/PLATELET
Basophils Absolute: 0.1 10*3/uL (ref 0.0–0.1)
Basophils Relative: 0.9 % (ref 0.0–3.0)
Eosinophils Absolute: 0.2 10*3/uL (ref 0.0–0.7)
Eosinophils Relative: 2.8 % (ref 0.0–5.0)
HCT: 37.5 % — ABNORMAL LOW (ref 39.0–52.0)
Hemoglobin: 12.2 g/dL — ABNORMAL LOW (ref 13.0–17.0)
Lymphocytes Relative: 31.7 % (ref 12.0–46.0)
Lymphs Abs: 2.7 10*3/uL (ref 0.7–4.0)
MCHC: 32.5 g/dL (ref 30.0–36.0)
MCV: 85.7 fl (ref 78.0–100.0)
Monocytes Absolute: 0.4 10*3/uL (ref 0.1–1.0)
Monocytes Relative: 5.3 % (ref 3.0–12.0)
Neutro Abs: 5 10*3/uL (ref 1.4–7.7)
Neutrophils Relative %: 59.3 % (ref 43.0–77.0)
Platelets: 238 10*3/uL (ref 150.0–400.0)
RBC: 4.38 Mil/uL (ref 4.22–5.81)
RDW: 14 % (ref 11.5–15.5)
WBC: 8.4 10*3/uL (ref 4.0–10.5)

## 2019-12-17 LAB — B12 AND FOLATE PANEL
Folate: >24.8 ng/mL (ref >5.9)
Vitamin B-12: 302 pg/mL (ref 211–911)

## 2019-12-18 LAB — IRON,TIBC AND FERRITIN PANEL
%SAT: 24 % (calc) (ref 20–48)
Ferritin: 129 ng/mL (ref 24–380)
Iron: 75 ug/dL (ref 50–180)
TIBC: 311 mcg/dL (calc) (ref 250–425)

## 2019-12-19 ENCOUNTER — Encounter: Payer: BC Managed Care – PPO | Admitting: Gastroenterology

## 2020-01-10 ENCOUNTER — Encounter: Payer: Self-pay | Admitting: Gastroenterology

## 2020-01-15 ENCOUNTER — Ambulatory Visit (AMBULATORY_SURGERY_CENTER): Payer: BC Managed Care – PPO | Admitting: Gastroenterology

## 2020-01-15 ENCOUNTER — Encounter: Payer: Self-pay | Admitting: Gastroenterology

## 2020-01-15 ENCOUNTER — Other Ambulatory Visit: Payer: Self-pay

## 2020-01-15 VITALS — BP 165/66 | HR 60 | Temp 97.8°F | Resp 14 | Ht 69.0 in | Wt 178.0 lb

## 2020-01-15 DIAGNOSIS — K640 First degree hemorrhoids: Secondary | ICD-10-CM

## 2020-01-15 DIAGNOSIS — K6389 Other specified diseases of intestine: Secondary | ICD-10-CM | POA: Diagnosis not present

## 2020-01-15 DIAGNOSIS — K5720 Diverticulitis of large intestine with perforation and abscess without bleeding: Secondary | ICD-10-CM

## 2020-01-15 DIAGNOSIS — K5730 Diverticulosis of large intestine without perforation or abscess without bleeding: Secondary | ICD-10-CM

## 2020-01-15 MED ORDER — SODIUM CHLORIDE 0.9 % IV SOLN
500.0000 mL | Freq: Once | INTRAVENOUS | Status: DC
Start: 2020-01-15 — End: 2020-01-15

## 2020-01-15 NOTE — Op Note (Signed)
[facility] Patient Name: Jackson [name] Procedure Date: 01/15/2020 10:51 AM MRN: [id number] Endoscopist: Gerrit Heck , MD Age: 64 Referring MD:  Date of Birth: 01/27/1956 Gender: Male Account #: [id number] Procedure:                Colonoscopy Indications:              Abdominal pain in the left lower quadrant, ,                            Abnormal CT of the GI tract, Follow-up of                            diverticulitis,                           64 yo male with history of diverticulosis with                            recurrent diverticulitis, including episode in                            09/2019 treated with PO antibiotics. Had CT in                            11/2019 which demonstrated diverticulitis with                            chronic abscess. Good response to another round of                            PO antibiotics. Medicines:                Monitored Anesthesia Care Procedure:                Pre-Anesthesia Assessment:                           - Prior to the procedure, a History and Physical                            was performed, and patient medications and                            allergies were reviewed. The patient's tolerance of                            previous anesthesia was also reviewed. The risks                            and benefits of the procedure and the sedation                            options and risks were discussed with the patient.  All questions were answered, and informed consent                            was obtained. Prior Anticoagulants: The patient has                            taken no previous anticoagulant or antiplatelet                            agents. ASA Grade Assessment: II - A patient with                            mild systemic disease. After reviewing the risks                            and benefits, the patient was deemed in                            satisfactory condition to  undergo the procedure.                           After obtaining informed consent, the colonoscope                            was passed under direct vision. Throughout the                            procedure, the patient's blood pressure, pulse, and                            oxygen saturations were monitored continuously. The                            Colonoscope was introduced through the anus and                            advanced to the the terminal ileum. The colonoscopy                            was performed without difficulty. The patient                            tolerated the procedure well. The quality of the                            bowel preparation was good. The terminal ileum,                            ileocecal valve, appendiceal orifice, and rectum                            were photographed. Scope In: 11:07:35 AM Scope Out: 11:24:44 AM Scope Withdrawal Time: 0 hours 14 minutes 14 seconds  Total Procedure Duration: 0  hours 17 minutes 9 seconds  Findings:                 The perianal and digital rectal examinations were                            normal.                           Multiple small and large-mouthed diverticula were                            found in the entire colon. This was most dense in                            the sigmoid colon. There was an area of mucosal                            edema and erythema in the sigmoid colon from 20-35                            cm from the anal verge, with the most pronounced                            segment of inflammation from 20-25 cm. Several                            mucosal biopsies were taken with a cold forceps for                            histology. Estimated blood loss was minimal. The                            mucosa in the remainder of the colon was otherwise                            normal appearing.                           Non-bleeding internal hemorrhoids were found during                             retroflexion. The hemorrhoids were small.                           The terminal ileum appeared normal. Complications:            No immediate complications. Estimated Blood Loss:     Estimated blood loss was minimal. Impression:               - Diverticulosis in the entire examined colon.                            Biopsied.                           -  Non-bleeding internal hemorrhoids.                           - The examined portion of the ileum was normal. Recommendation:           - Patient has a contact number available for                            emergencies. The signs and symptoms of potential                            delayed complications were discussed with the                            patient. Return to normal activities tomorrow.                            Written discharge instructions were provided to the                            patient.                           - Resume previous diet.                           - Continue present medications.                           - Await pathology results.                           - Refer to a colo-rectal surgeon at appointment to                            be scheduled to discuss possible elective segmental                            resection for recurrent diverticulitis.                           - Follow-up with Carl Best in the GI                            clinic at appointment to be scheduled. Gerrit Heck, MD 01/15/2020 11:33:12 AM

## 2020-01-15 NOTE — Progress Notes (Signed)
Called to room to assist during endoscopic procedure.  Patient ID and intended procedure confirmed with present staff. Received instructions for my participation in the procedure from the performing physician.  

## 2020-01-15 NOTE — Progress Notes (Signed)
Report to PACU, RN, vss, BBS= Clear.  

## 2020-01-15 NOTE — Patient Instructions (Signed)
HANDOUTS PROVIDED ON: Hemorrhoids and Diverticulosis/Diverticulitis  The biopsies taken today have been sent for pathology.  The results can take 1-3 weeks to receive.  When your next colonoscopy should occur will be based on the pathology results.    Follow-up with Carl Best in the GI clinic appointment to be scheduled.  Will discuss referral to colo-rectal surgeon for possible elective segmental resection for recurrent diverticulitis.  You may resume your previous diet and medication schedule.  Thank you for allowing [place] to care for you today!!!     YOU HAD AN ENDOSCOPIC PROCEDURE TODAY AT [facility]:   Refer to the procedure report that was given to you for any specific questions about what was found during the examination.  If the procedure report does not answer your questions, please call your gastroenterologist to clarify.  If you requested that your care partner not be given the details of your procedure findings, then the procedure report has been included in a sealed envelope for you to review at your convenience later.  YOU SHOULD EXPECT: Some feelings of bloating in the abdomen. Passage of more gas than usual.  Walking can help get rid of the air that was put into your GI tract during the procedure and reduce the bloating. If you had a lower endoscopy (such as a colonoscopy or flexible sigmoidoscopy) you may notice spotting of blood in your stool or on the toilet paper. If you underwent a bowel prep for your procedure, you may not have a normal bowel movement for a few days.  Please Note:  You might notice some irritation and congestion in your nose or some drainage.  This is from the oxygen used during your procedure.  There is no need for concern and it should clear up in a day or so.  SYMPTOMS TO REPORT IMMEDIATELY:   Following lower endoscopy (colonoscopy or flexible sigmoidoscopy):  Excessive amounts of blood in the stool  Significant tenderness  or worsening of abdominal pains  Swelling of the abdomen that is new, acute  Fever of 100F or higher    For urgent or emergent issues, a gastroenterologist can be reached at any hour by calling [phone]. Do not use MyChart messaging for urgent concerns.    DIET:  We do recommend a small meal at first, but then you may proceed to your regular diet.  Drink plenty of fluids but you should avoid alcoholic beverages for 24 hours.  ACTIVITY:  You should plan to take it easy for the rest of today and you should NOT DRIVE or use heavy machinery until tomorrow (because of the sedation medicines used during the test).    FOLLOW UP: Our staff will call the number listed on your records 48-72 hours following your procedure to check on you and address any questions or concerns that you may have regarding the information given to you following your procedure. If we do not reach you, we will leave a message.  We will attempt to reach you two times.  During this call, we will ask if you have developed any symptoms of COVID 19. If you develop any symptoms (ie: fever, flu-like symptoms, shortness of breath, cough etc.) before then, please call [phone].  If you test positive for Covid 19 in the 2 weeks post procedure, please call and report this information to [place].    If any biopsies were taken you will be contacted by phone or by letter within the next 1-3 weeks.  Please call us at [phone] if you have not heard about the biopsies in 3 weeks.    SIGNATURES/CONFIDENTIALITY: You and/or your care partner have signed paperwork which will be entered into your electronic medical record.  These signatures attest to the fact that that the information above on your After Visit Summary has been reviewed and is understood.  Full responsibility of the confidentiality of this discharge information lies with you and/or your care-partner.

## 2020-01-15 NOTE — Progress Notes (Signed)
History reviewed today  VS CW  

## 2020-01-17 ENCOUNTER — Telehealth: Payer: Self-pay

## 2020-01-17 ENCOUNTER — Encounter: Payer: Self-pay | Admitting: Family Medicine

## 2020-01-17 ENCOUNTER — Telehealth: Payer: Self-pay | Admitting: *Deleted

## 2020-01-17 NOTE — Telephone Encounter (Signed)
Spoke with patient today to let him know that a referral was faxed today to [facility].  They will contact him with an appointment date and time.  I asked patient give me a call back is he has not heard from [facility] in 1-2 weeks.  Patient verbalized understandig.

## 2020-01-17 NOTE — Telephone Encounter (Signed)
No answer for post procedure call back. Left mesage for patient to call with questions or concerns.

## 2020-01-17 NOTE — Telephone Encounter (Signed)
Pt is wanting to inform the nurse he is doing fine

## 2020-01-17 NOTE — Telephone Encounter (Signed)
  Follow up Call-  Call back number 01/15/2020  Post procedure Call Back phone  # [phone]  Permission to leave phone message Yes     Patient questions:  Do you have a fever, pain , or abdominal swelling? No. Pain Score  0 *  Have you tolerated food without any problems? Yes.    Have you been able to return to your normal activities? Yes.    Do you have any questions about your discharge instructions: Diet   No. Medications  No. Follow up visit  No.  Do you have questions or concerns about your Care? No.  Actions: * If pain score is 4 or above: No action needed, pain <4.  1. Have you developed a fever since your procedure? no  2.   Have you had an respiratory symptoms (SOB or cough) since your procedure? no  3.   Have you tested positive for COVID 19 since your procedure no  4.   Have you had any family members/close contacts diagnosed with the COVID 19 since your procedure?  no   If yes to any of these questions please route to Joylene John, RN and Joella Prince, RN

## 2020-01-17 NOTE — Telephone Encounter (Signed)
Please advise 

## 2020-01-22 MED ORDER — VARDENAFIL HCL 20 MG PO TABS: 10.0000 mg | ORAL_TABLET | Freq: Every day | ORAL | 0 refills | Status: AC | PRN

## 2020-01-22 NOTE — Telephone Encounter (Signed)
Spoke with patient  Pt stated he took Rx levitra 20mg  in the passed and sometime he cut pill in half taking only 10mg 

## 2020-01-23 ENCOUNTER — Encounter: Payer: Self-pay | Admitting: Gastroenterology

## 2020-02-14 ENCOUNTER — Ambulatory Visit: Payer: Self-pay | Admitting: General Surgery

## 2020-02-14 NOTE — H&P (Signed)
The patient is a 64 year old male who presents with diverticulitis. 64 year old male who presents to the office with long-standing history of diverticular disease. He has had episodes of diverticulitis for approximately the last 10 years. These have significantly gotten worse this year. He describes pain in his lower abdomen before and after bowel movements. He was seen by GI and a colonoscopy was performed. This showed narrowing and edema of the sigmoid colon and multiple diverticuli throughout colon. CT scan was personally reviewed and shows thickening of the sigmoid colon with a small chronic-appearing abscess cavity at the left pelvic brim. Past surgical history significant for robotic prostatectomy approximately 10 years ago and laparoscopic cholecystectomy. Patient denies any heart disease. He did undergo a stress test approximately 3-4 years ago due to some atypical chest pain with exercise. This was normal per patient, and cardiologists reviewed these findings and felt that it showed no sign of cardiac disease.   Past Surgical History Antonietta Jewel, [place]; 02/14/2020 1:23 PM) Gallbladder Surgery - Laparoscopic Oral Surgery Prostate Surgery - Removal  Diagnostic Studies History (Chanel Teressa Senter, CMA; 02/14/2020 1:23 PM) Colonoscopy within last year  Allergies (Chanel Teressa Senter, CMA; 02/14/2020 1:24 PM) No Known Drug Allergies [02/14/2020]: Allergies Reconciled  Medication History (Chanel Teressa Senter, CMA; 02/14/2020 1:24 PM) Atorvastatin Calcium (40MG  Tablet, Oral) Active. Tylenol (Oral) Specific strength unknown - Active. Aspirin (81MG  Tablet, Oral) Active. Medications Reconciled  Social History Antonietta Jewel, CMA; 02/14/2020 1:23 PM) Alcohol use Occasional alcohol use. Caffeine use Carbonated beverages. Illicit drug use Remotely quit drug use. Tobacco use Never smoker.  Family History ([place], [place]; 02/14/2020 1:23 PM) Alcohol Abuse Brother. Arthritis  Father. Depression Mother. Diabetes Mellitus Brother. Hypertension Brother. Ischemic Bowel Disease Father. Respiratory Condition Mother.  Other Problems (Chanel Teressa Senter, CMA; 02/14/2020 1:23 PM) Arthritis Diverticulosis Prostate Cancer     Review of Systems (Chanel Nolan CMA; 02/14/2020 1:23 PM) General Not Present- Appetite Loss, Chills, Fatigue, Fever, Night Sweats, Weight Gain and Weight Loss. Skin Present- Dryness. Not Present- Change in Wart/Mole, Hives, Jaundice, New Lesions, Non-Healing Wounds, Rash and Ulcer. HEENT Present- Seasonal Allergies. Not Present- Earache, Hearing Loss, Hoarseness, Nose Bleed, Oral Ulcers, Ringing in the Ears, Sinus Pain, Sore Throat, Visual Disturbances, Wears glasses/contact lenses and Yellow Eyes. Respiratory Not Present- Bloody sputum, Chronic Cough, Difficulty Breathing, Snoring and Wheezing. Breast Not Present- Breast Mass, Breast Pain, Nipple Discharge and Skin Changes. Cardiovascular Not Present- Chest Pain, Difficulty Breathing Lying Down, Leg Cramps, Palpitations, Rapid Heart Rate, Shortness of Breath and Swelling of Extremities. Gastrointestinal Present- Abdominal Pain. Not Present- Bloating, Bloody Stool, Change in Bowel Habits, Chronic diarrhea, Constipation, Difficulty Swallowing, Excessive gas, Gets full quickly at meals, Hemorrhoids, Indigestion, Nausea, Rectal Pain and Vomiting. Male Genitourinary Not Present- Blood in Urine, Change in Urinary Stream, Frequency, Impotence, Nocturia, Painful Urination, Urgency and Urine Leakage. Musculoskeletal Not Present- Back Pain, Joint Pain, Joint Stiffness, Muscle Pain, Muscle Weakness and Swelling of Extremities. Neurological Not Present- Decreased Memory, Fainting, Headaches, Numbness, Seizures, Tingling, Tremor, Trouble walking and Weakness. Psychiatric Not Present- Anxiety, Bipolar, Change in Sleep Pattern, Depression, Fearful and Frequent crying. Endocrine Not Present- Cold Intolerance,  Excessive Hunger, Hair Changes, Heat Intolerance, Hot flashes and New Diabetes. Hematology Not Present- Blood Thinners, Easy Bruising, Excessive bleeding, Gland problems, HIV and Persistent Infections.  Vitals (Chanel Nolan CMA; 02/14/2020 1:25 PM) 02/14/2020 1:24 PM Weight: 181.25 lb Height: 69in Body Surface Area: 1.98 m Body Mass Index: 26.77 kg/m  Temp.: 96.74F  Pulse: 85 (Regular)  BP: 134/82(Sitting, Left Arm, Standard)  Physical Exam Leighton Ruff MD; 2/63/3354 1:52 PM)  General Mental Status-Alert. General Appearance-Cooperative.  Abdomen Palpation/Percussion Palpation and Percussion of the abdomen reveal - Soft and Non Tender.    Assessment & Plan Leighton Ruff MD; 5/62/5638 1:39 PM)  DIVERTICULAR DISEASE (K57.90) Impression: 64 year old male who presents to the office with chronic diverticulitis and what appears to be a small chronic abscess cavity. There does not appear to be any acute inflammation on CT scan. Colonoscopy shows no sign of malignancy. I have recommended a robotic sigmoidectomy. Risks, benefits and alternatives have been discussed with the patient and he is willing to proceed with surgery. Given the close contact of the abscess cavity to the ureter, I have recommended. Preoperative cystoscopy and firefly injection to assist with ureter identification. We also discussed that due to his history of robotic prostatectomy, his surgery may be more difficult. The surgery and anatomy were described to the patient as well as the risks of surgery and the possible complications. These include: Bleeding, deep abdominal infections and possible wound complications such as hernia and infection, damage to adjacent structures, leak of surgical connections, which can lead to other surgeries and possibly an ostomy, possible need for other procedures, such as abscess drains in radiology, possible prolonged hospital stay, possible diarrhea from removal of  part of the colon, possible constipation from narcotics, possible bowel, bladder or sexual dysfunction if having rectal surgery, prolonged fatigue/weakness or appetite loss, possible early recurrence of of disease, possible complications of their medical problems such as heart disease or arrhythmias or lung problems, death (less than 1%). I believe the patient understands and wishes to proceed with the surgery.

## 2020-02-19 ENCOUNTER — Other Ambulatory Visit: Payer: Self-pay | Admitting: Urology

## 2020-03-13 NOTE — Patient Instructions (Addendum)
DUE TO COVID-19 ONLY ONE VISITOR IS ALLOWED TO COME WITH YOU AND STAY IN THE WAITING ROOM ONLY DURING PRE OP AND PROCEDURE DAY OF SURGERY. THE 1 VISITOR  MAY VISIT WITH YOU AFTER SURGERY IN YOUR PRIVATE ROOM DURING VISITING HOURS ONLY!  YOU NEED TO HAVE A COVID 19 TEST ON_11/9______ @_2 :00 pm______, THIS TEST MUST BE DONE BEFORE SURGERY,  COVID TESTING SITE [place] [address], IT IS ON THE RIGHT GOING OUT [address] APPROXIMATELY  2 MINUTES PAST [employer] ON THE RIGHT. ONCE YOUR COVID TEST IS COMPLETED,  PLEASE BEGIN THE QUARANTINE INSTRUCTIONS AS OUTLINED IN YOUR HANDOUT.                Jackson [name]    Your procedure is scheduled on: 03/28/20   Report to [facility] Main  Entrance   Report to Short Stay at 5:30 AM     Call this number if you have problems the morning of surgery [phone]   Midnight. BRUSH YOUR TEETH MORNING OF SURGERY AND RINSE YOUR MOUTH OUT, NO CHEWING GUM CANDY OR MINTS.  Follow the instructions from  Dr. Manon Hilding office for bowel prep.  Drink plenty of fluids the day of prep to prevent dehydration  DRINK 2 PRESURGERY ENSURE DRINKS THE NIGHT BEFORE SURGERY AT 10:00 PM .   NO SOLIDS AFTER MIDNIGHT THE DAY PRIOR TO THE SURGERY.   NOTHING BY MOUTH EXCEPT CLEAR LIQUIDS UNTIL 4:30 AM    PLEASE FINISH PRESURGERY ENSURE DRINK PER SURGEON BY 4:30 AM     Take these medicines the morning of surgery with A SIP OF WATER: None                                 You may not have any metal on your body including              piercings  Do not wear jewelry,  lotions, powders or deodorant              Men may shave face and neck.   Do not bring valuables to the hospital. [place].  Contacts, dentures or bridgework may not be worn into surgery.        Special Instructions: N/A              Please read over the following fact sheets you were  given: _____________________________________________________________________             [facility] - Preparing for Surgery Before surgery, you can play an important role.   Because skin is not sterile, your skin needs to be as free of germs as possible.   You can reduce the number of germs on your skin by washing with CHG (chlorahexidine gluconate) soap before surgery.   CHG is an antiseptic cleaner which kills germs and bonds with the skin to continue killing germs even after washing. Please DO NOT use if you have an allergy to CHG or antibacterial soaps.   If your skin becomes reddened/irritated stop using the CHG and inform your nurse when you arrive at Short Stay.   You may shave your face/neck.  Please follow these instructions carefully:  1.  Shower with CHG Soap the night before surgery and the  morning of Surgery.  2.  If you choose to wash your hair, wash your hair first as usual with your  normal  shampoo.  3.  After you shampoo, rinse your hair and body thoroughly to remove the  shampoo.                                        4.  Use CHG as you would any other liquid soap.  You can apply chg directly  to the skin and wash                       Gently with a scrungie or clean washcloth.  5.  Apply the CHG Soap to your body ONLY FROM THE NECK DOWN.   Do not use on face/ open                           Wound or open sores. Avoid contact with eyes, ears mouth and genitals (private parts).                       Wash face,  Genitals (private parts) with your normal soap.             6.  Wash thoroughly, paying special attention to the area where your surgery  will be performed.  7.  Thoroughly rinse your body with warm water from the neck down.  8.  DO NOT shower/wash with your normal soap after using and rinsing off  the CHG Soap.             9.  Pat yourself dry with a clean towel.            10.  Wear clean pajamas.            11.  Place clean sheets on your bed the night of  your first shower and do not  sleep with pets. Day of Surgery : Do not apply any lotions/deodorants the morning of surgery.  Please wear clean clothes to the hospital/surgery center.  FAILURE TO FOLLOW THESE INSTRUCTIONS MAY RESULT IN THE CANCELLATION OF YOUR SURGERY PATIENT SIGNATURE_________________________________  NURSE SIGNATURE__________________________________  ________________________________________________________________________   Jackson [name]  An incentive spirometer is a tool that can help keep your lungs clear and active. This tool measures how well you are filling your lungs with each breath. Taking long deep breaths may help reverse or decrease the chance of developing breathing (pulmonary) problems (especially infection) following:  A long period of time when you are unable to move or be active. BEFORE THE PROCEDURE   If the spirometer includes an indicator to show your best effort, your nurse or respiratory therapist will set it to a desired goal.  If possible, sit up straight or lean slightly forward. Try not to slouch.  Hold the incentive spirometer in an upright position. INSTRUCTIONS FOR USE  1. Sit on the edge of your bed if possible, or sit up as far as you can in bed or on a chair. 2. Hold the incentive spirometer in an upright position. 3. Breathe out normally. 4. Place the mouthpiece in your mouth and seal your lips tightly around it. 5. Breathe in slowly and as deeply as possible, raising the piston or the ball toward the top of the column. 6. Hold your breath for 3-5 seconds or for  as long as possible. Allow the piston or ball to fall to the bottom of the column. 7. Remove the mouthpiece from your mouth and breathe out normally. 8. Rest for a few seconds and repeat Steps 1 through 7 at least 10 times every 1-2 hours when you are awake. Take your time and take a few normal breaths between deep breaths. 9. The spirometer may include an indicator to  show your best effort. Use the indicator as a goal to work toward during each repetition. 10. After each set of 10 deep breaths, practice coughing to be sure your lungs are clear. If you have an incision (the cut made at the time of surgery), support your incision when coughing by placing a pillow or rolled up towels firmly against it. Once you are able to get out of bed, walk around indoors and cough well. You may stop using the incentive spirometer when instructed by your caregiver.  RISKS AND COMPLICATIONS  Take your time so you do not get dizzy or light-headed.  If you are in pain, you may need to take or ask for pain medication before doing incentive spirometry. It is harder to take a deep breath if you are having pain. AFTER USE  Rest and breathe slowly and easily.  It can be helpful to keep track of a log of your progress. Your caregiver can provide you with a simple table to help with this. If you are using the spirometer at home, follow these instructions: [place] IF:   You are having difficultly using the spirometer.  You have trouble using the spirometer as often as instructed.  Your pain medication is not giving enough relief while using the spirometer.  You develop fever of 100.5 F (38.1 C) or higher. SEEK IMMEDIATE MEDICAL CARE IF:   You cough up bloody sputum that had not been present before.  You develop fever of 102 F (38.9 C) or greater.  You develop worsening pain at or near the incision site. MAKE SURE YOU:   Understand these instructions.  Will watch your condition.  Will get help right away if you are not doing well or get worse. Document Released: 09/13/2006 Document Revised: 07/26/2011 Document Reviewed: 11/14/2006 [facility] Patient Information 2014 [place], [place].   ________________________________________________________________________

## 2020-03-19 ENCOUNTER — Other Ambulatory Visit: Payer: Self-pay

## 2020-03-19 ENCOUNTER — Encounter (HOSPITAL_COMMUNITY)
Admission: RE | Admit: 2020-03-19 | Discharge: 2020-03-19 | Disposition: A | Discharge status: Home / Self Care | Payer: Medicare Other | Source: Ambulatory Visit | Attending: General Surgery | Admitting: General Surgery

## 2020-03-19 ENCOUNTER — Encounter (HOSPITAL_COMMUNITY): Payer: Self-pay

## 2020-03-19 DIAGNOSIS — Z01812 Encounter for preprocedural laboratory examination: Secondary | ICD-10-CM | POA: Diagnosis not present

## 2020-03-19 LAB — CBC
HCT: 39.8 % (ref 39.0–52.0)
Hemoglobin: 12.8 g/dL — ABNORMAL LOW (ref 13.0–17.0)
MCH: 28.2 pg (ref 26.0–34.0)
MCHC: 32.2 g/dL (ref 30.0–36.0)
MCV: 87.7 fL (ref 80.0–100.0)
Platelets: 311 10*3/uL (ref 150–400)
RBC: 4.54 MIL/uL (ref 4.22–5.81)
RDW: 13.2 % (ref 11.5–15.5)
WBC: 6.1 10*3/uL (ref 4.0–10.5)
nRBC: 0 % (ref 0.0–0.2)

## 2020-03-19 LAB — BASIC METABOLIC PANEL
Anion gap: 10 (ref 5–15)
BUN: 10 mg/dL (ref 8–23)
CO2: 24 mmol/L (ref 22–32)
Calcium: 9.4 mg/dL (ref 8.9–10.3)
Chloride: 106 mmol/L (ref 98–111)
Creatinine, Ser: 0.8 mg/dL (ref 0.61–1.24)
GFR, Estimated: >60 mL/min (ref >60)
Glucose, Bld: 100 mg/dL — ABNORMAL HIGH (ref 70–99)
Potassium: 4.8 mmol/L (ref 3.5–5.1)
Sodium: 140 mmol/L (ref 135–145)

## 2020-03-19 NOTE — Progress Notes (Signed)
COVID Vaccine Completed:Yes Date COVID Vaccine completed:08/14/19 - Booster 03/05/20 COVID vaccine manufacturer: Pfizer    Moderna   [name]   PCP - Dr. Zack Seal Cardiologist - none  Chest x-ray - no EKG - no Stress Test - 2019. Done in [place] because of chest pain while working in the heat. Was not cardiac ECHO - no Cardiac Cath - no Pacemaker/ICD device last checked:NA  Sleep Study - no CPAP -   Fasting Blood Sugar - NA Checks Blood Sugar _____ times a day  Blood Thinner Instructions:ASA stopped in September 2021 Aspirin Instructions:none Last Dose:  Anesthesia review:   Patient denies shortness of breath, fever, cough and chest pain at PAT appointment yes   Patient verbalized understanding of instructions that were given to them at the PAT appointment. Patient was also instructed that they will need to review over the PAT instructions again at home before surgery. Yes  Pt is fit and active no SOB

## 2020-03-25 ENCOUNTER — Other Ambulatory Visit (HOSPITAL_COMMUNITY)
Admission: RE | Admit: 2020-03-25 | Discharge: 2020-03-25 | Disposition: A | Discharge status: Home / Self Care | Payer: Medicare Other | Source: Ambulatory Visit | Attending: General Surgery | Admitting: General Surgery

## 2020-03-25 DIAGNOSIS — Z20822 Contact with and (suspected) exposure to covid-19: Secondary | ICD-10-CM | POA: Insufficient documentation

## 2020-03-25 DIAGNOSIS — Z01812 Encounter for preprocedural laboratory examination: Secondary | ICD-10-CM | POA: Insufficient documentation

## 2020-03-25 LAB — SARS CORONAVIRUS 2 (TAT 6-24 HRS): SARS Coronavirus 2: NEGATIVE

## 2020-03-27 MED ORDER — BUPIVACAINE LIPOSOME 1.3 % IJ SUSP
20.0000 mL | Freq: Once | INTRAMUSCULAR | Status: DC
  Filled 2020-03-27: qty 20

## 2020-03-28 ENCOUNTER — Other Ambulatory Visit: Payer: Self-pay

## 2020-03-28 ENCOUNTER — Inpatient Hospital Stay (HOSPITAL_COMMUNITY): Payer: Medicare Other | Admitting: Certified Registered Nurse Anesthetist

## 2020-03-28 ENCOUNTER — Inpatient Hospital Stay (HOSPITAL_COMMUNITY)
Admission: RE | Admit: 2020-03-28 | Discharge: 2020-03-30 | DRG: 331 | Disposition: A | Discharge status: Home / Self Care | Payer: Medicare Other | Attending: General Surgery | Admitting: General Surgery

## 2020-03-28 ENCOUNTER — Encounter (HOSPITAL_COMMUNITY): Payer: Self-pay | Admitting: General Surgery

## 2020-03-28 ENCOUNTER — Encounter (HOSPITAL_COMMUNITY)
Admission: RE | Disposition: A | Discharge status: Home / Self Care | Payer: Self-pay | Source: Home / Self Care | Attending: General Surgery

## 2020-03-28 DIAGNOSIS — Z811 Family history of alcohol abuse and dependence: Secondary | ICD-10-CM

## 2020-03-28 DIAGNOSIS — F1990 Other psychoactive substance use, unspecified, uncomplicated: Secondary | ICD-10-CM | POA: Diagnosis present

## 2020-03-28 DIAGNOSIS — Z8249 Family history of ischemic heart disease and other diseases of the circulatory system: Secondary | ICD-10-CM

## 2020-03-28 DIAGNOSIS — Z8261 Family history of arthritis: Secondary | ICD-10-CM

## 2020-03-28 DIAGNOSIS — K5732 Diverticulitis of large intestine without perforation or abscess without bleeding: Secondary | ICD-10-CM | POA: Diagnosis present

## 2020-03-28 DIAGNOSIS — Z8546 Personal history of malignant neoplasm of prostate: Secondary | ICD-10-CM

## 2020-03-28 DIAGNOSIS — Z833 Family history of diabetes mellitus: Secondary | ICD-10-CM | POA: Diagnosis not present

## 2020-03-28 DIAGNOSIS — Z9079 Acquired absence of other genital organ(s): Secondary | ICD-10-CM

## 2020-03-28 DIAGNOSIS — K5790 Diverticulosis of intestine, part unspecified, without perforation or abscess without bleeding: Secondary | ICD-10-CM | POA: Diagnosis present

## 2020-03-28 DIAGNOSIS — Z23 Encounter for immunization: Secondary | ICD-10-CM

## 2020-03-28 DIAGNOSIS — Z818 Family history of other mental and behavioral disorders: Secondary | ICD-10-CM

## 2020-03-28 DIAGNOSIS — Z20822 Contact with and (suspected) exposure to covid-19: Secondary | ICD-10-CM | POA: Diagnosis present

## 2020-03-28 HISTORY — PX: CYSTOSCOPY WITH STENT PLACEMENT: SHX5790

## 2020-03-28 LAB — TYPE AND SCREEN
ABO/RH(D): A POS
Antibody Screen: NEGATIVE

## 2020-03-28 LAB — ABO/RH: ABO/RH(D): A POS

## 2020-03-28 SURGERY — COLECTOMY, PARTIAL, ROBOT-ASSISTED, LAPAROSCOPIC
Anesthesia: General | Site: Abdomen

## 2020-03-28 MED ORDER — LACTATED RINGERS IV SOLN: INTRAVENOUS | Status: DC | PRN

## 2020-03-28 MED ORDER — LACTATED RINGERS IV SOLN
INTRAVENOUS | Status: DC
  Administered 2020-03-28: 1000 mL via INTRAVENOUS

## 2020-03-28 MED ORDER — PROPOFOL 10 MG/ML IV BOLUS
INTRAVENOUS | Status: AC
  Filled 2020-03-28: qty 20

## 2020-03-28 MED ORDER — ALVIMOPAN 12 MG PO CAPS
12.0000 mg | ORAL_CAPSULE | ORAL | Status: AC
  Administered 2020-03-28: 12 mg via ORAL
  Filled 2020-03-28: qty 1

## 2020-03-28 MED ORDER — ONDANSETRON HCL 4 MG/2ML IJ SOLN
4.0000 mg | Freq: Four times a day (QID) | INTRAMUSCULAR | Status: DC | PRN
  Administered 2020-03-28: 4 mg via INTRAVENOUS
  Filled 2020-03-28: qty 2

## 2020-03-28 MED ORDER — SODIUM CHLORIDE 0.9 % IV SOLN
2.0000 g | Freq: Two times a day (BID) | INTRAVENOUS | Status: AC
  Administered 2020-03-28: 21:00:00 2 g via INTRAVENOUS
  Filled 2020-03-28: qty 2

## 2020-03-28 MED ORDER — ACETAMINOPHEN 10 MG/ML IV SOLN: 1000.0000 mg | Freq: Once | INTRAVENOUS | Status: DC | PRN

## 2020-03-28 MED ORDER — ROCURONIUM BROMIDE 100 MG/10ML IV SOLN
INTRAVENOUS | Status: DC | PRN
  Administered 2020-03-28: 30 mg via INTRAVENOUS
  Administered 2020-03-28: 70 mg via INTRAVENOUS

## 2020-03-28 MED ORDER — BUPIVACAINE LIPOSOME 1.3 % IJ SUSP
INTRAMUSCULAR | Status: DC | PRN
  Administered 2020-03-28: 20 mL

## 2020-03-28 MED ORDER — ONDANSETRON HCL 4 MG/2ML IJ SOLN
INTRAMUSCULAR | Status: AC
  Filled 2020-03-28: qty 2

## 2020-03-28 MED ORDER — ACETAMINOPHEN 500 MG PO TABS
1000.0000 mg | ORAL_TABLET | ORAL | Status: AC
  Administered 2020-03-28: 1000 mg via ORAL
  Filled 2020-03-28: qty 2

## 2020-03-28 MED ORDER — FENTANYL CITRATE (PF) 100 MCG/2ML IJ SOLN
INTRAMUSCULAR | Status: DC | PRN
  Administered 2020-03-28 (×2): 50 ug via INTRAVENOUS
  Administered 2020-03-28: 150 ug via INTRAVENOUS

## 2020-03-28 MED ORDER — ONDANSETRON HCL 4 MG/2ML IJ SOLN
INTRAMUSCULAR | Status: DC | PRN
  Administered 2020-03-28: 4 mg via INTRAVENOUS

## 2020-03-28 MED ORDER — ALUM & MAG HYDROXIDE-SIMETH 200-200-20 MG/5ML PO SUSP: 30.0000 mL | Freq: Four times a day (QID) | ORAL | Status: DC | PRN

## 2020-03-28 MED ORDER — FENTANYL CITRATE (PF) 250 MCG/5ML IJ SOLN
INTRAMUSCULAR | Status: AC
  Filled 2020-03-28: qty 5

## 2020-03-28 MED ORDER — ENSURE PRE-SURGERY PO LIQD
592.0000 mL | Freq: Once | ORAL | Status: DC
  Filled 2020-03-28: qty 592

## 2020-03-28 MED ORDER — MIDAZOLAM HCL 5 MG/5ML IJ SOLN
INTRAMUSCULAR | Status: DC | PRN
  Administered 2020-03-28: 2 mg via INTRAVENOUS

## 2020-03-28 MED ORDER — INFLUENZA VAC SPLIT QUAD 0.5 ML IM SUSY: 0.5000 mL | PREFILLED_SYRINGE | INTRAMUSCULAR | Status: DC

## 2020-03-28 MED ORDER — BUPIVACAINE-EPINEPHRINE 0.25% -1:200000 IJ SOLN
INTRAMUSCULAR | Status: DC | PRN
  Administered 2020-03-28: 30 mL

## 2020-03-28 MED ORDER — SUGAMMADEX SODIUM 500 MG/5ML IV SOLN
INTRAVENOUS | Status: AC
  Filled 2020-03-28: qty 5

## 2020-03-28 MED ORDER — MIDAZOLAM HCL 2 MG/2ML IJ SOLN
INTRAMUSCULAR | Status: AC
  Filled 2020-03-28: qty 2

## 2020-03-28 MED ORDER — DEXMEDETOMIDINE (PRECEDEX) IN NS 20 MCG/5ML (4 MCG/ML) IV SYRINGE
PREFILLED_SYRINGE | INTRAVENOUS | Status: AC
  Filled 2020-03-28: qty 5

## 2020-03-28 MED ORDER — PHENYLEPHRINE HCL (PRESSORS) 10 MG/ML IV SOLN
INTRAVENOUS | Status: DC | PRN
  Administered 2020-03-28: 40 ug via INTRAVENOUS

## 2020-03-28 MED ORDER — LIDOCAINE HCL 2 % IJ SOLN
INTRAMUSCULAR | Status: AC
  Filled 2020-03-28: qty 20

## 2020-03-28 MED ORDER — KETAMINE HCL 10 MG/ML IJ SOLN
INTRAMUSCULAR | Status: AC
  Filled 2020-03-28: qty 1

## 2020-03-28 MED ORDER — LABETALOL HCL 5 MG/ML IV SOLN
INTRAVENOUS | Status: DC | PRN
  Administered 2020-03-28: 5 mg via INTRAVENOUS

## 2020-03-28 MED ORDER — DEXMEDETOMIDINE (PRECEDEX) IN NS 20 MCG/5ML (4 MCG/ML) IV SYRINGE
PREFILLED_SYRINGE | INTRAVENOUS | Status: DC | PRN
  Administered 2020-03-28: 10 ug via INTRAVENOUS

## 2020-03-28 MED ORDER — OXYCODONE HCL 5 MG/5ML PO SOLN: 5.0000 mg | Freq: Once | ORAL | Status: DC | PRN

## 2020-03-28 MED ORDER — SODIUM CHLORIDE 0.9 % IV SOLN
2.0000 g | INTRAVENOUS | Status: AC
  Administered 2020-03-28: 2 g via INTRAVENOUS
  Filled 2020-03-28: qty 2

## 2020-03-28 MED ORDER — GLYCOPYRROLATE PF 0.2 MG/ML IJ SOSY
PREFILLED_SYRINGE | INTRAMUSCULAR | Status: AC
  Filled 2020-03-28: qty 1

## 2020-03-28 MED ORDER — ALBUMIN HUMAN 5 % IV SOLN: INTRAVENOUS | Status: DC | PRN

## 2020-03-28 MED ORDER — LIDOCAINE 2% (20 MG/ML) 5 ML SYRINGE
INTRAMUSCULAR | Status: AC
  Filled 2020-03-28: qty 5

## 2020-03-28 MED ORDER — 0.9 % SODIUM CHLORIDE (POUR BTL) OPTIME
TOPICAL | Status: DC | PRN
  Administered 2020-03-28: 2000 mL

## 2020-03-28 MED ORDER — INDOCYANINE GREEN 25 MG IV SOLR
INTRAVENOUS | Status: DC | PRN
  Administered 2020-03-28: 3 mg via INTRAVENOUS

## 2020-03-28 MED ORDER — LACTATED RINGERS IR SOLN
Status: DC | PRN
  Administered 2020-03-28: 1000 mL

## 2020-03-28 MED ORDER — GABAPENTIN 300 MG PO CAPS
300.0000 mg | ORAL_CAPSULE | ORAL | Status: AC
  Administered 2020-03-28: 300 mg via ORAL
  Filled 2020-03-28: qty 1

## 2020-03-28 MED ORDER — ACETAMINOPHEN 500 MG PO TABS
1000.0000 mg | ORAL_TABLET | Freq: Four times a day (QID) | ORAL | Status: DC
  Administered 2020-03-28 – 2020-03-30 (×8): 1000 mg via ORAL
  Filled 2020-03-28 (×8): qty 2

## 2020-03-28 MED ORDER — PROPOFOL 10 MG/ML IV BOLUS
INTRAVENOUS | Status: DC | PRN
  Administered 2020-03-28 (×2): 100 mg via INTRAVENOUS

## 2020-03-28 MED ORDER — STERILE WATER FOR INJECTION IJ SOLN
INTRAMUSCULAR | Status: DC | PRN
  Administered 2020-03-28: 15 mL via INTRAMUSCULAR

## 2020-03-28 MED ORDER — STERILE WATER FOR INJECTION IJ SOLN
INTRAMUSCULAR | Status: AC
  Filled 2020-03-28: qty 10

## 2020-03-28 MED ORDER — KCL IN DEXTROSE-NACL 20-5-0.45 MEQ/L-%-% IV SOLN
INTRAVENOUS | Status: DC
  Filled 2020-03-28: qty 1000

## 2020-03-28 MED ORDER — METHYLENE BLUE 0.5 % INJ SOLN
INTRAVENOUS | Status: AC
  Filled 2020-03-28: qty 10

## 2020-03-28 MED ORDER — LIDOCAINE HCL (CARDIAC) PF 100 MG/5ML IV SOSY
PREFILLED_SYRINGE | INTRAVENOUS | Status: DC | PRN
  Administered 2020-03-28: 40 mg via INTRAVENOUS

## 2020-03-28 MED ORDER — FENTANYL CITRATE (PF) 100 MCG/2ML IJ SOLN: 25.0000 ug | INTRAMUSCULAR | Status: DC | PRN

## 2020-03-28 MED ORDER — HYDROMORPHONE HCL 1 MG/ML IJ SOLN: 0.5000 mg | INTRAMUSCULAR | Status: DC | PRN

## 2020-03-28 MED ORDER — ENSURE SURGERY PO LIQD
237.0000 mL | Freq: Two times a day (BID) | ORAL | Status: DC
  Administered 2020-03-28: 237 mL via ORAL

## 2020-03-28 MED ORDER — DEXAMETHASONE SODIUM PHOSPHATE 10 MG/ML IJ SOLN
INTRAMUSCULAR | Status: DC | PRN
  Administered 2020-03-28: 5 mg via INTRAVENOUS

## 2020-03-28 MED ORDER — SIMETHICONE 80 MG PO CHEW
40.0000 mg | CHEWABLE_TABLET | Freq: Four times a day (QID) | ORAL | Status: DC | PRN
  Administered 2020-03-28: 21:00:00 40 mg via ORAL
  Filled 2020-03-28: qty 1

## 2020-03-28 MED ORDER — SODIUM CHLORIDE 0.9 % IR SOLN
Status: DC | PRN
  Administered 2020-03-28: 3000 mL via INTRAVESICAL

## 2020-03-28 MED ORDER — ACETAMINOPHEN 500 MG PO TABS: 1000.0000 mg | ORAL_TABLET | Freq: Once | ORAL | Status: DC | PRN

## 2020-03-28 MED ORDER — CHLORHEXIDINE GLUCONATE 0.12 % MT SOLN
15.0000 mL | Freq: Once | OROMUCOSAL | Status: AC
  Administered 2020-03-28: 15 mL via OROMUCOSAL

## 2020-03-28 MED ORDER — KETAMINE HCL 10 MG/ML IJ SOLN
INTRAMUSCULAR | Status: DC | PRN
  Administered 2020-03-28: 10 mg via INTRAVENOUS
  Administered 2020-03-28: 40 mg via INTRAVENOUS

## 2020-03-28 MED ORDER — ORAL CARE MOUTH RINSE: 15.0000 mL | Freq: Once | OROMUCOSAL | Status: AC

## 2020-03-28 MED ORDER — ONDANSETRON HCL 4 MG PO TABS: 4.0000 mg | ORAL_TABLET | Freq: Four times a day (QID) | ORAL | Status: DC | PRN

## 2020-03-28 MED ORDER — BUPIVACAINE-EPINEPHRINE (PF) 0.5% -1:200000 IJ SOLN
INTRAMUSCULAR | Status: AC
  Filled 2020-03-28: qty 30

## 2020-03-28 MED ORDER — ROCURONIUM BROMIDE 10 MG/ML (PF) SYRINGE
PREFILLED_SYRINGE | INTRAVENOUS | Status: AC
  Filled 2020-03-28: qty 20

## 2020-03-28 MED ORDER — SUGAMMADEX SODIUM 500 MG/5ML IV SOLN
INTRAVENOUS | Status: DC | PRN
  Administered 2020-03-28: 200 mg via INTRAVENOUS

## 2020-03-28 MED ORDER — ALVIMOPAN 12 MG PO CAPS
12.0000 mg | ORAL_CAPSULE | Freq: Two times a day (BID) | ORAL | Status: DC
  Administered 2020-03-29 – 2020-03-30 (×2): 12 mg via ORAL
  Filled 2020-03-28 (×3): qty 1

## 2020-03-28 MED ORDER — DEXAMETHASONE SODIUM PHOSPHATE 10 MG/ML IJ SOLN
INTRAMUSCULAR | Status: AC
  Filled 2020-03-28: qty 1

## 2020-03-28 MED ORDER — OXYCODONE HCL 5 MG PO TABS: 5.0000 mg | ORAL_TABLET | Freq: Once | ORAL | Status: DC | PRN

## 2020-03-28 MED ORDER — EPHEDRINE SULFATE-NACL 50-0.9 MG/10ML-% IV SOSY
PREFILLED_SYRINGE | INTRAVENOUS | Status: DC | PRN
  Administered 2020-03-28 (×3): 10 mg via INTRAVENOUS

## 2020-03-28 MED ORDER — GABAPENTIN 300 MG PO CAPS
300.0000 mg | ORAL_CAPSULE | Freq: Two times a day (BID) | ORAL | Status: DC
  Administered 2020-03-28 – 2020-03-30 (×4): 300 mg via ORAL
  Filled 2020-03-28 (×5): qty 1

## 2020-03-28 MED ORDER — ACETAMINOPHEN 160 MG/5ML PO SOLN: 1000.0000 mg | Freq: Once | ORAL | Status: DC | PRN

## 2020-03-28 MED ORDER — ROCURONIUM BROMIDE 10 MG/ML (PF) SYRINGE
PREFILLED_SYRINGE | INTRAVENOUS | Status: AC
  Filled 2020-03-28: qty 10

## 2020-03-28 MED ORDER — ATORVASTATIN CALCIUM 40 MG PO TABS
40.0000 mg | ORAL_TABLET | Freq: Every day | ORAL | Status: DC
  Administered 2020-03-28 – 2020-03-30 (×3): 40 mg via ORAL
  Filled 2020-03-28 (×3): qty 1

## 2020-03-28 MED ORDER — ENSURE PRE-SURGERY PO LIQD
296.0000 mL | Freq: Once | ORAL | Status: DC
  Filled 2020-03-28: qty 296

## 2020-03-28 MED ORDER — ENOXAPARIN SODIUM 40 MG/0.4ML ~~LOC~~ SOLN
40.0000 mg | SUBCUTANEOUS | Status: DC
  Administered 2020-03-29 – 2020-03-30 (×2): 40 mg via SUBCUTANEOUS
  Filled 2020-03-28 (×2): qty 0.4

## 2020-03-28 MED ORDER — LIDOCAINE HCL (PF) 2 % IJ SOLN
INTRAMUSCULAR | Status: DC | PRN
  Administered 2020-03-28: 1.5 mg/kg/h via INTRADERMAL

## 2020-03-28 MED ORDER — SUCCINYLCHOLINE CHLORIDE 200 MG/10ML IV SOSY
PREFILLED_SYRINGE | INTRAVENOUS | Status: AC
  Filled 2020-03-28: qty 10

## 2020-03-28 MED ORDER — SACCHAROMYCES BOULARDII 250 MG PO CAPS
250.0000 mg | ORAL_CAPSULE | Freq: Two times a day (BID) | ORAL | Status: DC
  Administered 2020-03-28 – 2020-03-30 (×4): 250 mg via ORAL
  Filled 2020-03-28 (×4): qty 1

## 2020-03-28 SURGICAL SUPPLY — 94 items
BAG URO CATCHER STRL LF (MISCELLANEOUS) ×4 IMPLANT
BLADE EXTENDED COATED 6.5IN (ELECTRODE) IMPLANT
CANNULA REDUC XI 12-8 STAPL (CANNULA)
CANNULA REDUC XI 12-8MM STAPL (CANNULA)
CANNULA REDUCER 12-8 DVNC XI (CANNULA) IMPLANT
CATH FOLEY 3WAY  5CC 16FR (CATHETERS) ×2
CATH FOLEY 3WAY 5CC 16FR (CATHETERS) ×2 IMPLANT
CATH URET 5FR 28IN OPEN ENDED (CATHETERS) ×4 IMPLANT
CELLS DAT CNTRL 66122 CELL SVR (MISCELLANEOUS) IMPLANT
CLOTH BEACON ORANGE TIMEOUT ST (SAFETY) ×4 IMPLANT
COVER SURGICAL LIGHT HANDLE (MISCELLANEOUS) ×8 IMPLANT
COVER TIP SHEARS 8 DVNC (MISCELLANEOUS) ×2 IMPLANT
COVER TIP SHEARS 8MM DA VINCI (MISCELLANEOUS) ×2
COVER WAND RF STERILE (DRAPES) ×4 IMPLANT
DECANTER SPIKE VIAL GLASS SM (MISCELLANEOUS) IMPLANT
DRAIN CHANNEL 19F RND (DRAIN) IMPLANT
DRAPE ARM DVNC X/XI (DISPOSABLE) ×8 IMPLANT
DRAPE COLUMN DVNC XI (DISPOSABLE) ×2 IMPLANT
DRAPE DA VINCI XI ARM (DISPOSABLE) ×8
DRAPE DA VINCI XI COLUMN (DISPOSABLE) ×2
DRAPE SURG IRRIG POUCH 19X23 (DRAPES) ×4 IMPLANT
DRSG OPSITE POSTOP 4X10 (GAUZE/BANDAGES/DRESSINGS) IMPLANT
DRSG OPSITE POSTOP 4X6 (GAUZE/BANDAGES/DRESSINGS) ×4 IMPLANT
DRSG OPSITE POSTOP 4X8 (GAUZE/BANDAGES/DRESSINGS) IMPLANT
ELECT PENCIL ROCKER SW 15FT (MISCELLANEOUS) ×4 IMPLANT
ELECT REM PT RETURN 15FT ADLT (MISCELLANEOUS) ×4 IMPLANT
ENDOLOOP SUT PDS II  0 18 (SUTURE)
ENDOLOOP SUT PDS II 0 18 (SUTURE) IMPLANT
EVACUATOR SILICONE 100CC (DRAIN) IMPLANT
GLOVE BIO SURGEON STRL SZ 6.5 (GLOVE) ×9 IMPLANT
GLOVE BIO SURGEONS STRL SZ 6.5 (GLOVE) ×3
GLOVE BIOGEL M STRL SZ7.5 (GLOVE) ×4 IMPLANT
GLOVE BIOGEL PI IND STRL 7.0 (GLOVE) ×4 IMPLANT
GLOVE BIOGEL PI INDICATOR 7.0 (GLOVE) ×4
GOWN STRL REUS W/TWL XL LVL3 (GOWN DISPOSABLE) ×16 IMPLANT
GRASPER SUT TROCAR 14GX15 (MISCELLANEOUS) IMPLANT
GUIDEWIRE STR DUAL SENSOR (WIRE) ×4 IMPLANT
HOLDER FOLEY CATH W/STRAP (MISCELLANEOUS) ×4 IMPLANT
IRRIG SUCT STRYKERFLOW 2 WTIP (MISCELLANEOUS) ×4
IRRIGATION SUCT STRKRFLW 2 WTP (MISCELLANEOUS) ×2 IMPLANT
KIT PROCEDURE DA VINCI SI (MISCELLANEOUS)
KIT PROCEDURE DVNC SI (MISCELLANEOUS) IMPLANT
KIT TURNOVER KIT A (KITS) IMPLANT
MANIFOLD NEPTUNE II (INSTRUMENTS) ×4 IMPLANT
NEEDLE INSUFFLATION 14GA 120MM (NEEDLE) ×4 IMPLANT
PACK CARDIOVASCULAR III (CUSTOM PROCEDURE TRAY) ×4 IMPLANT
PACK COLON (CUSTOM PROCEDURE TRAY) ×4 IMPLANT
PACK CYSTO (CUSTOM PROCEDURE TRAY) ×4 IMPLANT
PAD POSITIONING PINK XL (MISCELLANEOUS) ×4 IMPLANT
PORT LAP GEL ALEXIS MED 5-9CM (MISCELLANEOUS) IMPLANT
RELOAD STAPLER 4.3X60 GRN DVNC (STAPLE) ×4 IMPLANT
RTRCTR WOUND ALEXIS 18CM MED (MISCELLANEOUS)
SCISSORS LAP 5X35 DISP (ENDOMECHANICALS) IMPLANT
SEAL CANN UNIV 5-8 DVNC XI (MISCELLANEOUS) ×6 IMPLANT
SEAL XI 5MM-8MM UNIVERSAL (MISCELLANEOUS) ×6
SEALER VESSEL DA VINCI XI (MISCELLANEOUS) ×2
SEALER VESSEL EXT DVNC XI (MISCELLANEOUS) ×2 IMPLANT
SOLUTION ELECTROLUBE (MISCELLANEOUS) ×4 IMPLANT
STAPLER 60 DA VINCI SURE FORM (STAPLE) ×2
STAPLER 60 SUREFORM DVNC (STAPLE) ×2 IMPLANT
STAPLER CANNULA SEAL DVNC XI (STAPLE) IMPLANT
STAPLER CANNULA SEAL XI (STAPLE)
STAPLER ECHELON POWER CIR 29 (STAPLE) ×4 IMPLANT
STAPLER ECHELON POWER CIR 31 (STAPLE) IMPLANT
STAPLER RELOAD 4.3X60 GREEN (STAPLE) ×4
STAPLER RELOAD 4.3X60 GRN DVNC (STAPLE) ×4
STOPCOCK 4 WAY LG BORE MALE ST (IV SETS) ×8 IMPLANT
SUT ETHILON 2 0 PS N (SUTURE) IMPLANT
SUT NOVA NAB DX-16 0-1 5-0 T12 (SUTURE) ×8 IMPLANT
SUT PROLENE 2 0 KS (SUTURE) IMPLANT
SUT SILK 2 0 (SUTURE) ×2
SUT SILK 2 0 SH CR/8 (SUTURE) IMPLANT
SUT SILK 2-0 18XBRD TIE 12 (SUTURE) ×2 IMPLANT
SUT SILK 3 0 (SUTURE)
SUT SILK 3 0 SH CR/8 (SUTURE) ×4 IMPLANT
SUT SILK 3-0 18XBRD TIE 12 (SUTURE) IMPLANT
SUT V-LOC BARB 180 2/0GR6 GS22 (SUTURE)
SUT VIC AB 2-0 SH 18 (SUTURE) IMPLANT
SUT VIC AB 2-0 SH 27 (SUTURE)
SUT VIC AB 2-0 SH 27X BRD (SUTURE) IMPLANT
SUT VIC AB 3-0 SH 18 (SUTURE) IMPLANT
SUT VIC AB 4-0 PS2 27 (SUTURE) ×8 IMPLANT
SUT VICRYL 0 UR6 27IN ABS (SUTURE) ×4 IMPLANT
SUTURE V-LC BRB 180 2/0GR6GS22 (SUTURE) IMPLANT
SYR 10ML ECCENTRIC (SYRINGE) ×4 IMPLANT
SYS LAPSCP GELPORT 120MM (MISCELLANEOUS)
SYSTEM LAPSCP GELPORT 120MM (MISCELLANEOUS) IMPLANT
TOWEL OR 17X26 10 PK STRL BLUE (TOWEL DISPOSABLE) IMPLANT
TOWEL OR NON WOVEN STRL DISP B (DISPOSABLE) ×4 IMPLANT
TRAY FOLEY MTR SLVR 16FR STAT (SET/KITS/TRAYS/PACK) ×4 IMPLANT
TROCAR ADV FIXATION 5X100MM (TROCAR) ×4 IMPLANT
TUBING CONNECTING 10 (TUBING) ×6 IMPLANT
TUBING CONNECTING 10' (TUBING) ×2
TUBING INSUFFLATION 10FT LAP (TUBING) ×4 IMPLANT

## 2020-03-28 NOTE — H&P (Signed)
The patient is a 64 year old male who presents with diverticulitis. 64 year old male who presents to the office with long-standing history of diverticular disease. He has had episodes of diverticulitis for approximately the last 10 years. These have significantly gotten worse this year. He describes pain in his lower abdomen before and after bowel movements. He was seen by GI and a colonoscopy was performed. This showed narrowing and edema of the sigmoid colon and multiple diverticuli throughout colon. CT scan was personally reviewed and shows thickening of the sigmoid colon with a small chronic-appearing abscess cavity at the left pelvic brim. Past surgical history significant for robotic prostatectomy approximately 10 years ago and laparoscopic cholecystectomy. Patient denies any heart disease. He did undergo a stress test approximately 3-4 years ago due to some atypical chest pain with exercise. This was normal per patient, and cardiologists reviewed these findings and felt that it showed no sign of cardiac disease.   Past Surgical History Antonietta Jewel, [place]; 02/14/2020 1:23 PM) Gallbladder Surgery - Laparoscopic Oral Surgery Prostate Surgery - Removal  Diagnostic Studies History (Chanel Teressa Senter, CMA; 02/14/2020 1:23 PM) Colonoscopy within last year  Allergies (Chanel Teressa Senter, CMA; 02/14/2020 1:24 PM) No Known Drug Allergies [02/14/2020]: Allergies Reconciled  Medication History (Chanel Teressa Senter, CMA; 02/14/2020 1:24 PM) Atorvastatin Calcium (40MG  Tablet, Oral) Active. Tylenol (Oral) Specific strength unknown - Active. Aspirin (81MG  Tablet, Oral) Active. Medications Reconciled  Social History Antonietta Jewel, CMA; 02/14/2020 1:23 PM) Alcohol use Occasional alcohol use. Caffeine use Carbonated beverages. Illicit drug use Remotely quit drug use. Tobacco use Never smoker.  Family History ([place], [place]; 02/14/2020 1:23 PM) Alcohol Abuse Brother. Arthritis  Father. Depression Mother. Diabetes Mellitus Brother. Hypertension Brother. Ischemic Bowel Disease Father. Respiratory Condition Mother.  Other Problems (Chanel Teressa Senter, CMA; 02/14/2020 1:23 PM) Arthritis Diverticulosis Prostate Cancer     Review of Systems  General Not Present- Appetite Loss, Chills, Fatigue, Fever, Night Sweats, Weight Gain and Weight Loss. Skin Present- Dryness. Not Present- Change in Wart/Mole, Hives, Jaundice, New Lesions, Non-Healing Wounds, Rash and Ulcer. HEENT Present- Seasonal Allergies. Not Present- Earache, Hearing Loss, Hoarseness, Nose Bleed, Oral Ulcers, Ringing in the Ears, Sinus Pain, Sore Throat, Visual Disturbances, Wears glasses/contact lenses and Yellow Eyes. Respiratory Not Present- Bloody sputum, Chronic Cough, Difficulty Breathing, Snoring and Wheezing. Breast Not Present- Breast Mass, Breast Pain, Nipple Discharge and Skin Changes. Cardiovascular Not Present- Chest Pain, Difficulty Breathing Lying Down, Leg Cramps, Palpitations, Rapid Heart Rate, Shortness of Breath and Swelling of Extremities. Gastrointestinal Present- Abdominal Pain. Not Present- Bloating, Bloody Stool, Change in Bowel Habits, Chronic diarrhea, Constipation, Difficulty Swallowing, Excessive gas, Gets full quickly at meals, Hemorrhoids, Indigestion, Nausea, Rectal Pain and Vomiting. Male Genitourinary Not Present- Blood in Urine, Change in Urinary Stream, Frequency, Impotence, Nocturia, Painful Urination, Urgency and Urine Leakage. Musculoskeletal Not Present- Back Pain, Joint Pain, Joint Stiffness, Muscle Pain, Muscle Weakness and Swelling of Extremities. Neurological Not Present- Decreased Memory, Fainting, Headaches, Numbness, Seizures, Tingling, Tremor, Trouble walking and Weakness. Psychiatric Not Present- Anxiety, Bipolar, Change in Sleep Pattern, Depression, Fearful and Frequent crying. Endocrine Not Present- Cold Intolerance, Excessive Hunger, Hair Changes, Heat  Intolerance, Hot flashes and New Diabetes. Hematology Not Present- Blood Thinners, Easy Bruising, Excessive bleeding, Gland problems, HIV and Persistent Infections.  BP 138/80   Pulse 61   Temp 98.5 F (36.9 C) (Oral)   Resp 16   Ht 5' 9.5" (1.765 m)   Wt 79.8 kg   SpO2 98%   BMI 25.62 kg/m    Physical  Exam   General Mental Status-Alert. General Appearance-Cooperative. CV: RRR Lungs: CTA Abdomen Palpation/Percussion Palpation and Percussion of the abdomen reveal - Soft and Non Tender.    Assessment & Plan   DIVERTICULAR DISEASE (K57.90) Impression: 64 year old male who presents to the office with chronic diverticulitis and what appears to be a small chronic abscess cavity. There does not appear to be any acute inflammation on CT scan. Colonoscopy shows no sign of malignancy. I have recommended a robotic sigmoidectomy. Risks, benefits and alternatives have been discussed with the patient and he is willing to proceed with surgery. Given the close contact of the abscess cavity to the ureter, I have recommended. Preoperative cystoscopy and firefly injection to assist with ureter identification. We also discussed that due to his history of robotic prostatectomy, his surgery may be more difficult. The surgery and anatomy were described to the patient as well as the risks of surgery and the possible complications. These include: Bleeding, deep abdominal infections and possible wound complications such as hernia and infection, damage to adjacent structures, leak of surgical connections, which can lead to other surgeries and possibly an ostomy, possible need for other procedures, such as abscess drains in radiology, possible prolonged hospital stay, possible diarrhea from removal of part of the colon, possible constipation from narcotics, possible bowel, bladder or sexual dysfunction if having rectal surgery, prolonged fatigue/weakness or appetite loss, possible early recurrence of  of disease, possible complications of their medical problems such as heart disease or arrhythmias or lung problems, death (less than 1%). I believe the patient understands and wishes to proceed with the surgery.

## 2020-03-28 NOTE — Transfer of Care (Signed)
Immediate Anesthesia Transfer of Care Note  Patient: Jackson [name]  Procedure(s) Performed: XI ROBOT ASSISTED LAPAROSCOPIC SIGMOIDECTOMY (N/A Abdomen) FIREFLY INJECTION (N/A )  Patient Location: PACU  Anesthesia Type:General  Level of Consciousness: awake, alert  and oriented  Airway & Oxygen Therapy: Patient Spontanous Breathing and Patient connected to face mask  Post-op Assessment: Report given to RN and Post -op Vital signs reviewed and stable  Post vital signs: Reviewed and stable  Last Vitals:  Vitals Value Taken Time  BP 127/61 03/28/20 1019  Temp    Pulse 55 03/28/20 1020  Resp 15 03/28/20 1020  SpO2 100 % 03/28/20 1020  Vitals shown include unvalidated device data.  Last Pain:  Vitals:   03/28/20 0551  TempSrc:   PainSc: 2       Patients Stated Pain Goal: 1 (18/86/77 3736)  Complications: No complications documented.

## 2020-03-28 NOTE — Op Note (Signed)
03/28/2020  10:14 AM  PATIENT:  Jackson [name]  64 y.o. male  Patient Care Team: Vivi Barrack, MD as PCP - General (Family Medicine)  PRE-OPERATIVE DIAGNOSIS:  Sharmon Leyden  POST-OPERATIVE DIAGNOSIS:  DIVERTIUCLUAR STIRCTURE  PROCEDURE:  XI ROBOT ASSISTED LAPAROSCOPIC SIGMOIDECTOMY FIREFLY INJECTION  SURGEON:  Surgeon(s): Leighton Ruff, MD Ileana Roup, MD Ardis Hughs, MD  ASSISTANT: Dr Dema Severin   ANESTHESIA:   local and general  EBL: 150 ml Total I/O In: 1400 [I.V.:800; IV Piggyback:600] Out: 250 [Urine:100; Blood:150]  Delay start of Pharmacological VTE agent (>24hrs) due to surgical blood loss or risk of bleeding:  no  DRAINS: none   SPECIMEN:  Source of Specimen:  Sigmoid colon  DISPOSITION OF SPECIMEN:  PATHOLOGY  COUNTS:  YES  PLAN OF CARE: Admit to inpatient   PATIENT DISPOSITION:  PACU - hemodynamically stable.  INDICATION:    64 y.o. M with diverticular stricture and small chronic abscess.  I recommended segmental resection:  The anatomy & physiology of the digestive tract was discussed.  The pathophysiology was discussed.  Natural history risks without surgery was discussed.   I worked to give an overview of the disease and the frequent need to have multispecialty involvement.  I feel the risks of no intervention will lead to serious problems that outweigh the operative risks; therefore, I recommended a partial colectomy to remove the pathology.  Laparoscopic & open techniques were discussed.   Risks such as bleeding, infection, abscess, leak, reoperation, possible ostomy, hernia, heart attack, death, and other risks were discussed.  I noted a good likelihood this will help address the problem.   Goals of post-operative recovery were discussed as well.    The patient expressed understanding & wished to proceed with surgery.  OR FINDINGS:   Patient had significant diverticular disease of the L pelvic brim involving the entire  sigmoid colon  The anastomosis rests 14 cm from the anal verge by rigid proctoscopy.  DESCRIPTION:   Informed consent was confirmed.  The patient underwent general anaesthesia without difficulty.  The patient was positioned appropriately.  VTE prevention in place.  The patient's abdomen was clipped, prepped, & draped in a sterile fashion.  Surgical timeout confirmed our plan.  The patient was positioned in reverse Trendelenburg.  Abdominal entry was gained using a Varies needle in the LUQ.  Entry was clean.  I induced carbon dioxide insufflation.  An 10mm robotic port was placed in the RUQ.  Camera inspection revealed no injury.  Extra ports were carefully placed under direct laparoscopic visualization.  I laparoscopically reflected the greater omentum and the upper abdomen the small bowel in the upper abdomen. The patient was appropriately positioned and the robot was docked to the patient's left side.  Instruments were placed under direct visualization.    I mobilized the sigmoid colon off of the pelvic sidewall.  There were significant adhesions to the anterior and left pelvic wall.  These were taken down with a combination of the robotic vessel sealer and scissors.  Once this was free we were able to retract the colon out of the pelvis.  I scored the base of peritoneum of the right side of the mesentery of the left colon from the ligament of Treitz to the peritoneal reflection of the mid rectum.   I elevated the sigmoid mesentery and enetered into the retro-mesenteric plane. We were able to identify the left ureter and gonadal vessels. We kept those posterior within the retroperitoneum and elevated the  left colon mesentery off that. I did isolated IMA pedicle but did not ligate it yet.  I continued distally and got into the avascular plane posterior to the mesorectum. This allowed me to help mobilize the rectum as well by freeing the mesorectum off the sacrum.  I mobilized the peritoneal coverings  towards the peritoneal reflection on both the right and left sides of the rectum.  I could see the right and left ureters and stayed away from them.    I skeletonized the inferior mesenteric artery pedicle.   After confirming the left ureter was out of the way, I went ahead and ligated the inferior mesenteric artery pedicle with bipolar robotic vessel sealer well above its takeoff from the aorta.  I mobilized the colon off the retroperitoneum.  We ensured hemostasis. I skeletonized the mesorectum at the junction at the proximal rectum using blunt dissection & bipolar robotic vessel sealer.  I mobilized the left colon in a lateral to medial fashion off the line of Toldt up towards the splenic flexure to ensure good mobilization of the left colon to reach into the pelvis.  The rectosigmoid junction was transected using green load robotic 60 mm stapler.  The remaining portion of the mesentery was dissected just proximal to the ileocolic vessels using a robotic vessel sealer.  I inspected the abdomen for hemostasis.  There was no active bleeding.  The robot was then undocked and the 12 mm suprapubic port was enlarged to a Pfannenstiel incision.  The colon was brought out through this incision and transected over a pursestring device.  A 2-0 Prolene pursestring was placed.  A 29 mm EEA anvil was placed into the colon and the pursestring was tied tightly around this.  This was then placed back into the abdomen and the EEA stapler was inserted into the rectum and brought out through the distal portion of the stump.  An anastomosis was created without difficulty.  There was no tension noted there was no leak when tested with insufflation under irrigation.  All ports were removed and we switched to clean gowns, gloves, instruments and drapes.  The Pfannenstiel incision was then closed using a running 2-0 Vicryl suture for the peritoneum.  The fascia was closed using interrupted #1 Novafil sutures.  The subcutaneous  tissue was reapproximated with a running 2-0 Vicryl suture.  The skin was closed with a running 4-0 Vicryl subcuticular suture.  A sterile dressing was applied.  The port sites were closed using a running 4-0 Vicryl suture and Dermabond.  The patient was then awakened from anesthesia and sent to the postanesthesia care unit in stable condition.  All counts were correct per operating room staff.  An MD assistant was necessary for tissue manipulation, retraction and positioning due to the complexity of the case and hospital policies

## 2020-03-28 NOTE — Anesthesia Preprocedure Evaluation (Addendum)
Anesthesia Evaluation  Patient identified by MRN, date of birth, ID band Patient awake    Reviewed: Allergy & Precautions, NPO status , Patient's Chart, lab work & pertinent test results  History of Anesthesia Complications Negative for: history of anesthetic complications  Airway Mallampati: I  TM Distance: >3 FB Neck ROM: Full    Dental  (+) Dental Advisory Given, Teeth Intact   Pulmonary neg pulmonary ROS,  Covid-19 Nucleic Acid Test Results Lab Results      Component                Value               Date                      [place]              NEGATIVE            03/25/2020              breath sounds clear to auscultation       Cardiovascular negative cardio ROS   Rhythm:Regular     Neuro/Psych negative neurological ROS     GI/Hepatic Neg liver ROS, DIVERTIUCLUAR STIRCTURE   Endo/Other  negative endocrine ROS  Renal/GU negative Renal ROS     Musculoskeletal  (+) Arthritis ,   Abdominal   Peds  Hematology negative hematology ROS (+) Lab Results      Component                Value               Date                      WBC                      6.1                 03/19/2020                HGB                      12.8 (L)            03/19/2020                HCT                      39.8                03/19/2020                MCV                      87.7                03/19/2020                PLT                      311                 03/19/2020              Anesthesia Other Findings   Reproductive/Obstetrics  Anesthesia Physical Anesthesia Plan  ASA: I  Anesthesia Plan: General   Post-op Pain Management:    Induction: Intravenous  PONV Risk Score and Plan: 2 and Ondansetron and Dexamethasone  Airway Management Planned: Oral ETT  Additional Equipment: None  Intra-op Plan:   Post-operative Plan:   Informed Consent: I  have reviewed the patients History and Physical, chart, labs and discussed the procedure including the risks, benefits and alternatives for the proposed anesthesia with the patient or authorized representative who has indicated his/her understanding and acceptance.     Dental advisory given  Plan Discussed with: CRNA and Surgeon  Anesthesia Plan Comments:         Anesthesia Quick Evaluation

## 2020-03-28 NOTE — Op Note (Signed)
Preoperative diagnosis:  1. Sigmoid diverticulitis stricture  Postoperative diagnosis:  1. Same   Procedure: 1. Cystoscopy, bilateral ureteral instillation of firefly constrast   Surgeon: Ardis Hughs, MD   Anesthesia: General   Complications: None   Intraoperative findings:  Surgically absent prostate, soft membranous urethral stricture, no bladder mucosal abnormalities, orthotopic UOs.  EBL: Minimal   Specimens: None   Indication: Jackson [name] is a 64 y.o.  patient with patient with a history of diverticulitis.  Dr. Marcello Moores requested instillation of firefly contrast to help facilitate dissection of the pelvis.  After reviewing the management options for treatment, he elected to proceed with the above surgical procedure(s). We have discussed the potential benefits and risks of the procedure, side effects of the proposed treatment, the likelihood of the patient achieving the goals of the procedure, and any potential problems that might occur during the procedure or recuperation. Informed consent has been obtained.   Description of procedure:   The patient was taken to the operating room and general anesthesia was induced.  The patient was placed in the dorsal lithotomy position, prepped and draped in the usual sterile fashion, and preoperative antibiotics were administered. A preoperative time-out was performed.    A 21 French 30 degree cystoscope was gently passed through the patient's urethra into the bladder.  The bladder was subsequently emptied and then filled slowly up performing a 360 degrees cystoscopic evaluation.   This demonstrated orthotopic ureteral orifices, normal bladder mucosa with no evidence of colovesical fistula without mucosal abnormality.   I then advanced a 5 [place] open-ended ureteral catheter into the patient's left ureteral orifice and slowly pulled back while simultaneously instilling 7.50mL of firefly contrast.  I then repeated the process in the  patient's right ureter.    The surgery was then turned over to Dr. Marcello Moores for facilitation of the remainder of the case.

## 2020-03-28 NOTE — Anesthesia Procedure Notes (Signed)
Procedure Name: Intubation Performed by: Rosaland Lao, CRNA Pre-anesthesia Checklist: Patient identified, Emergency Drugs available, Suction available and Patient being monitored Patient Re-evaluated:Patient Re-evaluated prior to induction Oxygen Delivery Method: Circle system utilized Preoxygenation: Pre-oxygenation with 100% oxygen Induction Type: IV induction Ventilation: Mask ventilation without difficulty and Oral airway inserted - appropriate to patient size Laryngoscope Size: Mac and 4 Grade View: Grade II Tube type: Oral Tube size: 7.5 mm Number of attempts: 1 Airway Equipment and Method: Stylet and Oral airway Placement Confirmation: ETT inserted through vocal cords under direct vision,  positive ETCO2 and breath sounds checked- equal and bilateral Tube secured with: Tape Dental Injury: Teeth and Oropharynx as per pre-operative assessment

## 2020-03-29 LAB — BASIC METABOLIC PANEL
Anion gap: 7 (ref 5–15)
BUN: 10 mg/dL (ref 8–23)
CO2: 26 mmol/L (ref 22–32)
Calcium: 8.9 mg/dL (ref 8.9–10.3)
Chloride: 105 mmol/L (ref 98–111)
Creatinine, Ser: 0.88 mg/dL (ref 0.61–1.24)
GFR, Estimated: >60 mL/min (ref >60)
Glucose, Bld: 135 mg/dL — ABNORMAL HIGH (ref 70–99)
Potassium: 3.8 mmol/L (ref 3.5–5.1)
Sodium: 138 mmol/L (ref 135–145)

## 2020-03-29 LAB — CBC
HCT: 33.9 % — ABNORMAL LOW (ref 39.0–52.0)
Hemoglobin: 10.9 g/dL — ABNORMAL LOW (ref 13.0–17.0)
MCH: 27.9 pg (ref 26.0–34.0)
MCHC: 32.2 g/dL (ref 30.0–36.0)
MCV: 86.9 fL (ref 80.0–100.0)
Platelets: 246 10*3/uL (ref 150–400)
RBC: 3.9 MIL/uL — ABNORMAL LOW (ref 4.22–5.81)
RDW: 13.4 % (ref 11.5–15.5)
WBC: 15.6 10*3/uL — ABNORMAL HIGH (ref 4.0–10.5)
nRBC: 0 % (ref 0.0–0.2)

## 2020-03-29 MED ORDER — TRAMADOL HCL 50 MG PO TABS
50.0000 mg | ORAL_TABLET | Freq: Four times a day (QID) | ORAL | Status: DC | PRN
  Administered 2020-03-29: 15:00:00 50 mg via ORAL
  Administered 2020-03-29: 100 mg via ORAL
  Administered 2020-03-29: 14:00:00 50 mg via ORAL
  Filled 2020-03-29: qty 2
  Filled 2020-03-29 (×2): qty 1

## 2020-03-29 NOTE — Progress Notes (Signed)
1 Day Post-Op Robotic Sigmoidectomy Subjective: Pain controlled, no nausea, passing flatus, ambulating in hall  Objective: Vital signs in last 24 hours: Temp:  [97.4 F (36.3 C)-98.5 F (36.9 C)] 98.2 F (36.8 C) (11/13 0627) Pulse Rate:  [48-90] 53 (11/13 0627) Resp:  [12-26] 16 (11/13 0627) BP: (112-173)/(54-90) 115/60 (11/13 0627) SpO2:  [96 %-100 %] 99 % (11/13 0627)   Intake/Output from previous day: 11/12 0701 - 11/13 0700 In: 2861 [P.O.:460; I.V.:1801; IV Piggyback:600] Out: 2875 [Urine:2725; Blood:150] Intake/Output this shift: No intake/output data recorded.   General appearance: alert and cooperative GI: normal findings: soft, appropriately tender  Incision: no significant drainage  Lab Results:  Recent Labs    03/29/20 0617  WBC 15.6*  HGB 10.9*  HCT 33.9*  PLT 246   BMET Recent Labs    03/29/20 0617  NA 138  K 3.8  CL 105  CO2 26  GLUCOSE 135*  BUN 10  CREATININE 0.88  CALCIUM 8.9   PT/INR No results for input(s): LABPROT, INR in the last 72 hours. ABG No results for input(s): PHART, HCO3 in the last 72 hours.  Invalid input(s): PCO2, PO2  MEDS, Scheduled . acetaminophen  1,000 mg Oral Q6H  . alvimopan  12 mg Oral BID  . atorvastatin  40 mg Oral Daily  . enoxaparin (LOVENOX) injection  40 mg Subcutaneous Q24H  . feeding supplement  237 mL Oral BID BM  . gabapentin  300 mg Oral BID  . saccharomyces boulardii  250 mg Oral BID    Studies/Results: No results found.  Assessment: s/p Procedure(s): XI ROBOT ASSISTED LAPAROSCOPIC SIGMOIDECTOMY FIREFLY INJECTION Patient Active Problem List   Diagnosis Date Noted  . Diverticular disease 03/28/2020  . Diverticulitis of colon with perforation 11/30/2019  . LLQ pain 11/06/2019  . Diverticulosis 10/09/2019  . Dyslipidemia 08/01/2019  . Prostate cancer [facility]) s/p prostatectomy 08/01/2019  . Osteoarthritis 08/01/2019    Expected post op course  Plan: Advance diet to soft foods SL  IVF's Tramadol and tylenol for pain Ambulate in hall Poss d/c tomorrow   LOS: 1 day     .Rosario Adie, MD [facility], [place]    03/29/2020 9:25 AM

## 2020-03-30 LAB — BASIC METABOLIC PANEL
Anion gap: 7 (ref 5–15)
BUN: 10 mg/dL (ref 8–23)
CO2: 27 mmol/L (ref 22–32)
Calcium: 8.9 mg/dL (ref 8.9–10.3)
Chloride: 103 mmol/L (ref 98–111)
Creatinine, Ser: 0.74 mg/dL (ref 0.61–1.24)
GFR, Estimated: >60 mL/min (ref >60)
Glucose, Bld: 97 mg/dL (ref 70–99)
Potassium: 4.4 mmol/L (ref 3.5–5.1)
Sodium: 137 mmol/L (ref 135–145)

## 2020-03-30 LAB — CBC
HCT: 33.3 % — ABNORMAL LOW (ref 39.0–52.0)
Hemoglobin: 10.5 g/dL — ABNORMAL LOW (ref 13.0–17.0)
MCH: 28 pg (ref 26.0–34.0)
MCHC: 31.5 g/dL (ref 30.0–36.0)
MCV: 88.8 fL (ref 80.0–100.0)
Platelets: 221 10*3/uL (ref 150–400)
RBC: 3.75 MIL/uL — ABNORMAL LOW (ref 4.22–5.81)
RDW: 13.5 % (ref 11.5–15.5)
WBC: 12.4 10*3/uL — ABNORMAL HIGH (ref 4.0–10.5)
nRBC: 0 % (ref 0.0–0.2)

## 2020-03-30 MED ORDER — TRAMADOL HCL 50 MG PO TABS
50.0000 mg | ORAL_TABLET | Freq: Four times a day (QID) | ORAL | 0 refills | Status: DC | PRN
Start: 2020-03-30 — End: 2020-09-12

## 2020-03-30 NOTE — Discharge Instructions (Addendum)
ABDOMINAL SURGERY: POST OP INSTRUCTIONS  1. DIET: Follow a light bland diet the first 24 hours after arrival home, such as soup, liquids, crackers, etc.  Be sure to include lots of fluids daily.  Avoid fast food or heavy meals as your are more likely to get nauseated.  Do not eat any uncooked fruits or vegetables for the next 2 weeks as your colon heals. 2. Take your usually prescribed home medications unless otherwise directed. 3. PAIN CONTROL: a. Pain is best controlled by a usual combination of three different methods TOGETHER: i. Ice/Heat ii. Over the counter pain medication iii. Prescription pain medication b. Most patients will experience some swelling and bruising around the incisions.  Ice packs or heating pads (30-60 minutes up to 6 times a day) will help. Use ice for the first few days to help decrease swelling and bruising, then switch to heat to help relax tight/sore spots and speed recovery.  Some people prefer to use ice alone, heat alone, alternating between ice & heat.  Experiment to what works for you.  Swelling and bruising can take several weeks to resolve.   c. It is helpful to take an over-the-counter pain medication regularly for the first few weeks.  Choose one of the following that works best for you: i. Naproxen (Aleve, etc)  Two 220mg tabs twice a day ii. Ibuprofen (Advil, etc) Three 200mg tabs four times a day (every meal & bedtime) iii. Acetaminophen (Tylenol, etc) 500-650mg four times a day (every meal & bedtime) d. A  prescription for pain medication (such as oxycodone, hydrocodone, etc) should be given to you upon discharge.  Take your pain medication as prescribed.  i. If you are having problems/concerns with the prescription medicine (does not control pain, nausea, vomiting, rash, itching, etc), please call us [phone] to see if we need to switch you to a different pain medicine that will work better for you and/or control your side effect better. ii. If you  need a refill on your pain medication, please contact your pharmacy.  They will contact our office to request authorization. Prescriptions will not be filled after 5 pm or on week-ends. 4. Avoid getting constipated.  Between the surgery and the pain medications, it is common to experience some constipation.  Increasing fluid intake and taking a fiber supplement (such as Metamucil, Citrucel, FiberCon, MiraLax, etc) 1-2 times a day regularly will usually help prevent this problem from occurring.  A mild laxative (prune juice, Milk of Magnesia, MiraLax, etc) should be taken according to package directions if there are no bowel movements after 48 hours.   5. Watch out for diarrhea.  If you have many loose bowel movements, simplify your diet to bland foods & liquids for a few days.  Stop any stool softeners and decrease your fiber supplement.  Switching to mild anti-diarrheal medications (Kayopectate, Pepto Bismol) can help.  If this worsens or does not improve, please call us. 6. Wash / shower every day.  You may shower over the incision / wound.  Avoid baths until the skin is fully healed.  Continue to shower over incision(s) after the dressing is off. 7.  You may leave the incision open to air.  You may replace a dressing/Band-Aid to cover the incision for comfort if you wish. 8. ACTIVITIES as tolerated:   a. You may resume regular (light) daily activities beginning the next day--such as daily self-care, walking, climbing stairs--gradually increasing activities as tolerated.  If you can walk 30 minutes   without difficulty, it is safe to try more intense activity such as jogging, treadmill, bicycling, low-impact aerobics, swimming, etc. b. Save the most intensive and strenuous activity for last such as sit-ups, heavy lifting, contact sports, etc  Refrain from any heavy lifting or straining until you are off narcotics for pain control.   c. DO NOT PUSH THROUGH PAIN.  Let pain be your guide: If it hurts to do  something, don't do it.  Pain is your body warning you to avoid that activity for another week until the pain goes down. d. You may drive when you are no longer taking prescription pain medication, you can comfortably wear a seatbelt, and you can safely maneuver your car and apply brakes. e. You may have sexual intercourse when it is comfortable.  9. FOLLOW UP in our office a. Please call [facility] at [phone] to set up an appointment to see your surgeon in the office for a follow-up appointment approximately 1-2 weeks after your surgery. b. Make sure that you call for this appointment the day you arrive home to insure a convenient appointment time. 10. IF YOU HAVE DISABILITY OR FAMILY LEAVE FORMS, BRING THEM TO THE OFFICE FOR PROCESSING.  DO NOT GIVE THEM TO YOUR DOCTOR.   WHEN TO CALL US [phone]: 1. Poor pain control 2. Reactions / problems with new medications (rash/itching, nausea, etc)  3. Fever over 101.5 F (38.5 C) 4. Inability to urinate 5. Nausea and/or vomiting 6. Worsening swelling or bruising 7. Continued bleeding from incision. 8. Increased pain, redness, or drainage from the incision  The clinic staff is available to answer your questions during regular business hours (8:30am-5pm).  Please don't hesitate to call and ask to speak to one of our nurses for clinical concerns.   A surgeon from [facility] is always on call at the hospitals   If you have a medical emergency, go to the nearest emergency room or call 911.    [facility] [address], [address], [place], [place]  [address] ? MAIN: [phone] ? TOLL FREE: [phone] ? FAX [phone] [email or web address]   

## 2020-03-30 NOTE — Plan of Care (Signed)
Pt d/c via w/c w all belongings.

## 2020-03-30 NOTE — Progress Notes (Signed)
Reviewed d/c instructions w patient and wife, all questions answered and pt/wife verbalize understanding. Pt states that he has all of his personal belongings w him, and pt was d/ced via wc.

## 2020-03-30 NOTE — Discharge Summary (Signed)
Physician Discharge Summary  Patient ID: Jackson [name] MRN: [id number] DOB/AGE: 07/19/1955 64 y.o.  Admit date: 03/28/2020 Discharge date: 03/30/2020  Admission Diagnoses: diverticular stricture  Discharge Diagnoses:  Active Problems:   Diverticular disease   Discharged Condition: good  Hospital Course: Pt admitted after surgery.  Diet advanced as tolerated.  By POD 2 he was tolerating a diet and PO pain meds.  He was having good bowel function and urinating without difficulty.  Consults: None  Significant Diagnostic Studies: labs: cbc, bmet  Treatments: IV hydration and analgesia: acetaminophen, surgery: Robotic Sigmoidectomy  Discharge Exam: Blood pressure 119/62, pulse (!) 52, temperature 98.1 F (36.7 C), temperature source Oral, resp. rate 16, height 5' 9.5" (1.765 m), weight 79.8 kg, SpO2 98 %. Head: Normocephalic, without obvious abnormality, atraumatic GI: normal findings: soft, non-tender Incision/Wound: clean, dry, intact  Disposition: home   Allergies as of 03/30/2020   No Known Allergies     Medication List    STOP taking these medications   metroNIDAZOLE 500 MG tablet Commonly known as: FLAGYL   neomycin 500 MG tablet Commonly known as: MYCIFRADIN     TAKE these medications   acetaminophen 500 MG tablet Commonly known as: TYLENOL Take 1,000 mg by mouth every 6 (six) hours as needed.   ADULT GUMMY PO Take 2 tablets by mouth daily.   aspirin EC 81 MG tablet Take 81 mg by mouth daily.   atorvastatin 40 MG tablet Commonly known as: LIPITOR Take 1 tablet (40 mg total) by mouth daily.   FIBER SELECT GUMMIES PO Take 2 tablets by mouth daily.   GREEN TEA PO Take 315 mg by mouth daily as needed (energy).   traMADol 50 MG tablet Commonly known as: ULTRAM Take 1-2 tablets (50-100 mg total) by mouth every 6 (six) hours as needed for moderate pain or severe pain.   vardenafil 20 MG tablet Commonly known as: Levitra Take 0.5-1 tablets (10-20 mg  total) by mouth daily as needed for erectile dysfunction. What changed: how much to take        Signed: Rosario Adie 26/33/3545, 9:07 AM

## 2020-03-31 ENCOUNTER — Encounter (HOSPITAL_COMMUNITY): Payer: Self-pay | Admitting: General Surgery

## 2020-03-31 LAB — SURGICAL PATHOLOGY

## 2020-03-31 NOTE — Anesthesia Postprocedure Evaluation (Signed)
Anesthesia Post Note  Patient: Jackson [name]  Procedure(s) Performed: XI ROBOT ASSISTED LAPAROSCOPIC SIGMOIDECTOMY (N/A Abdomen) FIREFLY INJECTION (N/A )     Patient location during evaluation: PACU Anesthesia Type: General Level of consciousness: awake and alert Pain management: pain level controlled Vital Signs Assessment: post-procedure vital signs reviewed and stable Respiratory status: spontaneous breathing, nonlabored ventilation, respiratory function stable and patient connected to nasal cannula oxygen Cardiovascular status: blood pressure returned to baseline and stable Postop Assessment: no apparent nausea or vomiting Anesthetic complications: no   No complications documented.  Last Vitals:  Vitals:   03/29/20 2125 03/30/20 0539  BP: 128/67 119/62  Pulse: (!) 58 (!) 52  Resp: 16 16  Temp: 37.1 C 36.7 C  SpO2: 97% 98%    Last Pain:  Vitals:   03/30/20 0539  TempSrc: Oral  PainSc:                  Courtenay Creger

## 2020-04-02 ENCOUNTER — Telehealth: Payer: Self-pay

## 2020-04-02 NOTE — Telephone Encounter (Cosign Needed)
Transition Care Management Follow-up Telephone Call  Date of discharge and from where: 03/30/20 [facility] hospital  How have you been since you were released from the hospital? good  Any questions or concerns? No  Items Reviewed:  Did the pt receive and understand the discharge instructions provided? Yes   Medications obtained and verified? Yes   Other? No   Any new allergies since your discharge? No   Dietary orders reviewed? Yes  Do you have support at home? Yes   Home Care and Equipment/Supplies: Were home health services ordered? not applicable If so, what is the name of the agency?   Has the agency set up a time to come to the patient's home? not applicable Were any new equipment or medical supplies ordered?  No What is the name of the medical supply agency?  Were you able to get the supplies/equipment? not applicable Do you have any questions related to the use of the equipment or supplies? No  Functional Questionnaire: (I = Independent and D = Dependent) ADLs: I  Bathing/Dressing- I  Meal Prep- I  Eating- I  Maintaining continence- I  Transferring/Ambulation- I  Managing Meds- I  Follow up appointments reviewed:   PCP Hospital f/u appt confirmed? No  pt stated he will follow up with Dr Jerline Pain after surgery appt     [facility] f/u appt confirmed? Yes  Scheduled  To see surgeon for follow up on 04/15/20  Are transportation arrangements needed? No   If their condition worsens, is the pt aware to call PCP or go to the Emergency Dept.? Yes  Was the patient provided with contact information for the PCP's office or ED? Yes  Was to pt encouraged to call back with questions or concerns? Yes

## 2020-08-11 ENCOUNTER — Other Ambulatory Visit: Payer: Self-pay | Admitting: Family Medicine

## 2020-08-11 DIAGNOSIS — E785 Hyperlipidemia, unspecified: Secondary | ICD-10-CM

## 2020-09-12 ENCOUNTER — Ambulatory Visit (INDEPENDENT_AMBULATORY_CARE_PROVIDER_SITE_OTHER): Payer: Medicare Other

## 2020-09-12 ENCOUNTER — Encounter: Payer: Self-pay | Admitting: Family Medicine

## 2020-09-12 ENCOUNTER — Other Ambulatory Visit: Payer: Self-pay

## 2020-09-12 ENCOUNTER — Ambulatory Visit (INDEPENDENT_AMBULATORY_CARE_PROVIDER_SITE_OTHER): Payer: Medicare Other | Admitting: Family Medicine

## 2020-09-12 VITALS — BP 122/68 | HR 92 | Temp 98.6°F | Ht 69.5 in | Wt 191.4 lb

## 2020-09-12 DIAGNOSIS — L570 Actinic keratosis: Secondary | ICD-10-CM

## 2020-09-12 DIAGNOSIS — M1990 Unspecified osteoarthritis, unspecified site: Secondary | ICD-10-CM

## 2020-09-12 DIAGNOSIS — M25559 Pain in unspecified hip: Secondary | ICD-10-CM | POA: Diagnosis not present

## 2020-09-12 MED ORDER — MELOXICAM 15 MG PO TABS: 15.0000 mg | ORAL_TABLET | Freq: Every day | ORAL | 0 refills | Status: DC

## 2020-09-12 NOTE — Assessment & Plan Note (Signed)
Right hip pain consistent with osteoarthritis flare.  X-ray today shows degenerative changes based on my read.  We will await radiology read.  Will start meloxicam for 1 to 2 weeks.  Discussed home exercises and handout was given.  He will let me know if not proving and we can place referral to PT and/or sports medicine.

## 2020-09-12 NOTE — Progress Notes (Signed)
   Jackson [name] is a 65 y.o. male who presents today for an office visit.  Assessment/Plan:  Chronic Problems Addressed Today: Osteoarthritis Right hip pain consistent with osteoarthritis flare.  X-ray today shows degenerative changes based on my read.  We will await radiology read.  Will start meloxicam for 1 to 2 weeks.  Discussed home exercises and handout was given.  He will let me know if not proving and we can place referral to PT and/or sports medicine.    Actinic keratosis Skin lesion consistent with actinic keratosis.  Cryotherapy applied today.  See below procedure note.  He tolerated well.     Subjective:  HPI:  Patient here with right hip pain.  Started couple weeks ago.  Was laying in bed when he rolled over onto his right side and felt a grinding sensation.  He has had persistent pain since then.  Worse in the morning.  Feels very stiff in the morning.  Usually eases up throughout the day.  Tylenol helps.  Pain mostly localized to right hip and right buttocks.  Pain does not radiate.       Objective:  Physical Exam: BP 122/68   Pulse 92   Temp 98.6 F (37 C)   Ht 5' 9.5" (1.765 m)   Wt 191 lb 6.1 oz (86.8 kg)   SpO2 97%   BMI 27.86 kg/m   Gen: No acute distress, resting comfortably MSK: - Back: No deformities.  Nontender to palpation. - Right lower extremity: Limited internal and external rotation at right hip.  Neurovascular intact distally.  External rotation elicits pain. Skin: Actinic keratosis on superior scalp Neuro: Grossly normal, moves all extremities Psych: Normal affect and thought content  Cryotherapy Procedure Note  Pre-operative Diagnosis: Actinic keratosis  Locations: Scalp  Indications: Therapeutic  Procedure Details  Patient informed of risks (permanent scarring, infection, light or dark discoloration, bleeding, infection, weakness, numbness and recurrence of the lesion) and benefits of the procedure and verbal informed consent  obtained.  The areas are treated with liquid nitrogen therapy, frozen until ice ball extended 3 mm beyond lesion, allowed to thaw, and treated again. The patient tolerated procedure well.  The patient was instructed on post-op care, warned that there may be blister formation, redness and pain. Recommend OTC analgesia as needed for pain.  Condition: Stable  Complications: none.        Algis Greenhouse. Jerline Pain, MD 09/12/2020 9:12 AM

## 2020-09-12 NOTE — Assessment & Plan Note (Signed)
Skin lesion consistent with actinic keratosis.  Cryotherapy applied today.  See below procedure note.  He tolerated well.

## 2020-09-12 NOTE — Addendum Note (Signed)
Addended by: Vivi Barrack on: 09/12/2020 09:57 AM   Modules accepted: Orders

## 2020-09-12 NOTE — Patient Instructions (Signed)
It was very nice to see you today!  You have an arthritis flare in her hip.  Please start meloxicam.  Working on exercises.  We froze a spot on your scalp today.  Please let us know if your hip pain is not improving over the next couple of weeks.  Take care, Dr Jerline Pain  PLEASE NOTE:  If you had any lab tests please let us know if you have not heard back within a few days. You may see your results on mychart before we have a chance to review them but we will give you a call once they are reviewed by [place]. If we ordered any referrals today, please let us know if you have not heard from their office within the next week.   Please try these tips to maintain a healthy lifestyle:   Eat at least 3 REAL meals and 1-2 snacks per day.  Aim for no more than 5 hours between eating.  If you eat breakfast, please do so within one hour of getting up.    Each meal should contain half fruits/vegetables, one quarter protein, and one quarter carbs (no bigger than a computer mouse)   Cut down on sweet beverages. This includes juice, soda, and sweet tea.     Drink at least 1 glass of water with each meal and aim for at least 8 glasses per day   Exercise at least 150 minutes every week.

## 2020-09-15 ENCOUNTER — Other Ambulatory Visit: Payer: Self-pay | Admitting: *Deleted

## 2020-09-15 DIAGNOSIS — M1990 Unspecified osteoarthritis, unspecified site: Secondary | ICD-10-CM

## 2020-09-15 DIAGNOSIS — M25559 Pain in unspecified hip: Secondary | ICD-10-CM

## 2020-09-15 NOTE — Progress Notes (Signed)
Please inform patient of the following:  His xray shows degenerative changes as we discussed. Would like for him to let us know if his symptoms are not improving and we can refer to PT or sports medicine.  Jackson [name]. Jerline Pain, MD 09/15/2020 12:43 PM

## 2020-09-15 NOTE — Progress Notes (Signed)
[place]

## 2020-09-18 NOTE — Progress Notes (Signed)
I, Peterson Lombard, LAT, ATC acting as a scribe for Lynne Leader, MD.  Subjective:    I'm seeing this patient as a consultation for:  Dr. Dimas Chyle. Note will be routed back to referring provider/PCP.  CC: Right hip pain  HPI: Pt is a 65 y/o male c/o R hip pain ongoing since early April. MOI: Pt was laying in bed when he rolled over onto his R side and felt a grinding sensation. Pt c/o stiffness in the mornings and pain when trying to sleep. Pt locates pain to L-side low back/SIJ area. Pt notes improvement in pain since taking meloxicam.  Radiates: no LE numbness/tingling: no LE weakness: no Aggravates: any movements Treatments tried: meloxicam, Tylenol, sleeping in different positions, pain patches  Dx imaging: 09/12/20 R hip/pelvis  Past medical history, Surgical history, Family history, Social history, Allergies, and medications have been entered into the medical record, reviewed.   Review of Systems: No new headache, visual changes, nausea, vomiting, diarrhea, constipation, dizziness, abdominal pain, skin rash, fevers, chills, night sweats, weight loss, swollen lymph nodes, body aches, joint swelling, muscle aches, chest pain, shortness of breath, mood changes, visual or auditory hallucinations.   Objective:    Vitals:   09/19/20 1121  BP: 140/90  Pulse: (!) 50  SpO2: 98%   General: Well Developed, well nourished, and in no acute distress.  Neuro/Psych: Alert and oriented x3, extra-ocular muscles intact, able to move all 4 extremities, sensation grossly intact. Skin: Warm and dry, no rashes noted.  Respiratory: Not using accessory muscles, speaking in full sentences, trachea midline.  Cardiovascular: Pulses palpable, no extremity edema. Abdomen: Does not appear distended. MSK: L-spine normal-appearing Nontender midline. Decreased lumbar motion especially to flexion and lateral flexion. Lower extremity strength reflexes and sensation are intact distally.  Left hip  normal. Normal motion. Nontender. Normal strength.  Lab and Radiology Results  X-ray images L-spine obtained today personally and independently interpreted DDD especially dominant L5-S1.  No acute fractures visible.  No aggressive appearing bony lesions. Await formal radiology review  DG HIP UNILAT WITH PELVIS 2-3 VIEWS RIGHT  Result Date: 09/14/2020 CLINICAL DATA:  Hip pain. EXAM: DG HIP (WITH OR WITHOUT PELVIS) 2-3V RIGHT COMPARISON:  None. FINDINGS: No fracture or dislocation identified. No joint space narrowing in the hips. Lower lumbar facet degenerative changes. The right SI joint appears narrow compared to the left. A CT scan from June of 2021 demonstrated partial fusion of the right SI joint. No other abnormalities. IMPRESSION: 1. 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 2. Degenerative changes in the lower lumbar spine. 3. No other abnormalities. Electronically Signed   By: Dorise Bullion III M.D   On: 09/14/2020 16:38   I, Lynne Leader, personally (independently) visualized and performed the interpretation of the images attached in this note.   Impression and Recommendations:    Assessment and Plan: 65 y.o. male with 1 month right low back pain..  This occurs in the setting of remote prostate cancer status post proctocolectomy 10 years ago.  Pain most likely due to muscle dysfunction.  Patient does also have evidence of partial fusion right SI joint on prior CT scan however his pain is more left lateral and thought to be more muscular today.  Plan to continue meloxicam as needed.  We will add tizanidine at bedtime but main interventional be physical therapy.  Heating pad and TENS unit may also be helpful as well.  Recheck back in  6 weeks.  Return sooner if needed.  PDMP not reviewed this encounter. Orders Placed This Encounter  Procedures  . DG Lumbar Spine 2-3 Views    Standing Status:   Future    Number of Occurrences:   1     Standing Expiration Date:   09/19/2021    Order Specific Question:   Reason for Exam (SYMPTOM  OR DIAGNOSIS REQUIRED)    Answer:   eval rt low back pain    Order Specific Question:   Preferred imaging location?    Answer:   [facility]  . Ambulatory referral to Physical Therapy    Referral Priority:   Routine    Referral Type:   Physical Medicine    Referral Reason:   Specialty Services Required    Requested Specialty:   Physical Therapy   Meds ordered this encounter  Medications  . tiZANidine (ZANAFLEX) 4 MG tablet    Sig: Take 1 tablet (4 mg total) by mouth every 6 (six) hours as needed for muscle spasms.    Dispense:  30 tablet    Refill:  1    Discussed warning signs or symptoms. Please see discharge instructions. Patient expresses understanding.   The above documentation has been reviewed and is accurate and complete Lynne Leader, M.D.

## 2020-09-19 ENCOUNTER — Ambulatory Visit (INDEPENDENT_AMBULATORY_CARE_PROVIDER_SITE_OTHER): Payer: Medicare Other

## 2020-09-19 ENCOUNTER — Other Ambulatory Visit: Payer: Self-pay

## 2020-09-19 ENCOUNTER — Ambulatory Visit: Payer: Medicare Other | Admitting: Family Medicine

## 2020-09-19 VITALS — BP 140/90 | HR 50 | Ht 69.5 in | Wt 194.4 lb

## 2020-09-19 DIAGNOSIS — M545 Low back pain, unspecified: Secondary | ICD-10-CM

## 2020-09-19 MED ORDER — TIZANIDINE HCL 4 MG PO TABS: 4.0000 mg | ORAL_TABLET | Freq: Four times a day (QID) | ORAL | 1 refills | Status: DC | PRN

## 2020-09-19 NOTE — Patient Instructions (Addendum)
Thank you for coming in today.  I've referred you to Physical Therapy.  Let us know if you don't hear from them in one week.  Heating pad should help.   TENS should help.   Tizanidine muscle relaxer is mostly for bedtime.   TENS UNIT: This is helpful for muscle pain and spasm.   Search and Purchase a TENS 7000 2nd edition at  www.tenspros.com or www.[employer].com It should be less than $30.     TENS unit instructions: Do not shower or bathe with the unit on . Turn the unit off before removing electrodes or batteries . If the electrodes lose stickiness add a drop of water to the electrodes after they are disconnected from the unit and place on plastic sheet. If you continued to have difficulty, call the TENS unit company to purchase more electrodes. . Do not apply lotion on the skin area prior to use. Make sure the skin is clean and dry as this will help prolong the life of the electrodes. . After use, always check skin for unusual red areas, rash or other skin difficulties. If there are any skin problems, does not apply electrodes to the same area. . Never remove the electrodes from the unit by pulling the wires. . Do not use the TENS unit or electrodes other than as directed. . Do not change electrode placement without consultating your therapist or physician. Marland Kitchen Keep 2 fingers with between each electrode. . Wear time ratio is 2:1, on to off times.    For example on for 30 minutes off for 15 minutes and then on for 30 minutes off for 15 minutes    Recheck in 6 weeks.

## 2020-09-22 NOTE — Progress Notes (Signed)
Lumbar spine x-ray shows arthritis changes in the low back.

## 2020-10-01 ENCOUNTER — Other Ambulatory Visit: Payer: Self-pay

## 2020-10-01 ENCOUNTER — Encounter: Payer: Self-pay | Admitting: Rehabilitative and Restorative Service Providers"

## 2020-10-01 ENCOUNTER — Ambulatory Visit: Payer: Medicare Other | Admitting: Rehabilitative and Restorative Service Providers"

## 2020-10-01 DIAGNOSIS — R262 Difficulty in walking, not elsewhere classified: Secondary | ICD-10-CM

## 2020-10-01 DIAGNOSIS — M545 Low back pain, unspecified: Secondary | ICD-10-CM

## 2020-10-01 NOTE — Patient Instructions (Signed)
Access Code: [email or web address] URL: [email or web address] Date: 10/01/2020 Prepared by: Scot Jun  Exercises Supine Lower Trunk Rotation - 2 x daily - 7 x weekly - 1 sets - 5 reps - 15 hold Supine Bridge - 2 x daily - 7 x weekly - 3 sets - 10 reps - 2 hold Prone Press Up - 2 x daily - 7 x weekly - 2 sets - 10 reps - 1-2 hold Standing Lumbar Extension - 2 x daily - 7 x weekly - 10 reps - 2-3 sets

## 2020-10-01 NOTE — Therapy (Signed)
[facility] [address] [place], [place], [address] Phone: [phone]   Fax:  [phone]  Physical Therapy Evaluation  Patient Details  Name: Jackson [name] MRN: [id number] Date of Birth: [birth year] Referring Provider (PT): Dr. Lynne Leader   Encounter Date: 10/01/2020   PT End of Session - 10/01/20 0828    Visit Number 1    Number of Visits 12    Date for PT Re-Evaluation 11/26/20    Authorization Type [employer] Medicare    Progress Note Due on Visit 10    PT Start Time 0830    PT Stop Time 0907    PT Time Calculation (min) 37 min    Activity Tolerance Patient tolerated treatment well    Behavior During Therapy [facility] for tasks assessed/performed           Past Medical History:  Diagnosis Date  . Anginal pain ([place]) 2019   heat stress. stress test was negative  . Arthritis   . Diverticulitis   . Diverticulosis   . HLD (hyperlipidemia)   . Prostate cancer [facility])     Past Surgical History:  Procedure Laterality Date  . CHOLECYSTECTOMY  05/18/2007  . CYSTOSCOPY WITH STENT PLACEMENT N/A 03/28/2020   Procedure: FIREFLY INJECTION;  Surgeon: Ardis Hughs, MD;  Location: [facility];  Service: Urology;  Laterality: N/A;  . EYE SURGERY    . PROSTATECTOMY  2011  . REFRACTIVE SURGERY Bilateral    eyes  . TONSILLECTOMY      There were no vitals filed for this visit.    Subjective Assessment - 10/01/20 0840    Subjective Pt. indicated wife had COVID and he was sleeping on different mattress in spare bedroom.  Pt. stated getting pain in Rt side of back that he noted c getting in and out of bed and walking after getting up.  Pt. stated some improvement c medicine.  Pt. stated stiffness in morning.  Pt. stated muscle relaxers from last MD visit has helped reduce pain c sleep.  Pt. stated getting out of bed has improved.  Pt. stated he started some stretching that irriated Lt lumbar for a week or so. Pt. indicated he has been remodeling house or  about a year and is still working on it.    Limitations Standing;House hold activities    Diagnostic tests Lumbar xray showing DDD L5-S1    Patient Stated Goals Reduce pain, improve flexibility    Currently in Pain? Yes    Pain Score 3     Pain Location Back    Pain Orientation Lower    Pain Descriptors / Indicators Tightness;Sharp   sharp initially   Pain Type Acute pain    Pain Onset More than a month ago    Aggravating Factors  bed mobility, transfers, stiffness upon waking/standing    Pain Relieving Factors medicine, stretching, heat pad              [facility] PT Assessment - 10/01/20 0001      Assessment   Medical Diagnosis Low back pain    Referring Provider (PT) Dr. Lynne Leader    Onset Date/Surgical Date 08/15/20    Hand Dominance Right      Precautions   Precautions None      Balance Screen   Has the patient fallen in the past 6 months No    Has the patient had a decrease in activity level because of a fear of falling?  No    Is  the patient reluctant to leave their home because of a fear of falling?  No      Home Ecologist residence    Additional Comments "a few stairs" to enter house      Prior Function   Level of [place] Retired    Leisure walking Designer, jewellery   Overall Cognitive Status Within Functional Limits for tasks assessed      Observation/Other Assessments   Focus on Therapeutic Outcomes (FOTO)  intake 72%, predicted 79%      Sensation   Light Touch Appears Intact      Functional Tests   Functional tests Sit to Stand      Sit to Stand   Comments 18 inch transfer no UE, first try no complaints      ROM / Strength   AROM / PROM / Strength Strength;PROM;AROM      AROM   AROM Assessment Site Lumbar;Hip    Right/Left Hip Left;Right    Lumbar Flexion movement to mid shin, pain in Rt, reduced upon return    Lumbar Extension 25% WFL c Rt lumbar pain, repeated x 5 in standing  improved movement to 50%, reduced end range complaints.    Lumbar - Right Side Bend movement to femoral epicondyle c Rt lumbar pain    Lumbar - Left Side Bend movement to lateral knee joint, no pain      Strength   Overall Strength Comments Myotomal check WFL for bilateral leg    Strength Assessment Site Ankle;Knee;Hip    Right/Left Hip Left;Right    Right/Left Knee Left;Right    Right/Left Ankle Left;Right      Flexibility   Soft Tissue Assessment /Muscle Length yes    Hamstrings supine passive SLR 70 degrees bilateral, no back pain      Palpation   Spinal mobility Hypomobility L3, L4, L5 cPA      Special Tests   Other special tests (-) slump, crossed slr bilaterally                      Objective measurements completed on examination: See above findings.       [place] Adult PT Treatment/Exercise - 10/01/20 0001      Exercises   Exercises Other Exercises;Lumbar    Other Exercises  HEP instruction/performance c cues for techniques, handout provided.  Trial set performed of each for comprehension and symptom assessment.  HEP consisting of supine bridge, supine lumbar rotation strech, prone press up, standing lumbar extension      Manual Therapy   Manual therapy comments G3 cPA L2-L5                  PT Education - 10/01/20 0828    Education Details HEP, POC    Person(s) Educated Patient    Methods Explanation;Demonstration;Verbal cues;Handout    Comprehension Verbalized understanding;Returned demonstration            PT Short Term Goals - 10/01/20 4196      PT SHORT TERM GOAL #1   Title Patient will demonstrate independent use of home exercise program to maintain progress from in clinic treatments.    Time 3    Period Weeks    Status New    Target Date 10/22/20             PT Long Term Goals - 10/01/20 2229  PT LONG TERM GOAL #1   Title Patient will demonstrate/report pain at worst less than or equal to 2/10 to facilitate minimal  limitation in daily activity secondary to pain symptoms.    Time 8    Period Weeks    Status New    Target Date 11/26/20      PT LONG TERM GOAL #2   Title Patient will demonstrate independent use of home exercise program to facilitate ability to maintain/progress functional gains from skilled physical therapy services.    Time 8    Period Weeks    Status New    Target Date 11/26/20      PT LONG TERM GOAL #3   Title Pt. will demonstrate FOTO outcome > or = 79 % to indicated reduced disabilty due to condition.    Time 8    Period Weeks    Status New    Target Date 11/26/20      PT LONG TERM GOAL #4   Title Patient will demonstrate lumbar extension 100 % WFL s symptoms to facilitate upright standing, walking posture at PLOF s limitation.    Time 8    Period Weeks    Status New    Target Date 11/26/20      PT LONG TERM GOAL #5   Title Pt. will demonstrate/report ability to sleep s restriction.    Time 8    Period Weeks    Status New    Target Date 11/26/20                  Plan - 10/01/20 0830    Clinical Impression Statement Patient is a 65 y.o. who comes to clinic with complaints of low back pain with mobility deficits that impair their ability to perform usual daily and recreational functional activities without increase difficulty/symptoms at this time.  Patient to benefit from skilled PT services to address impairments and limitations to improve to previous level of function without restriction secondary to condition.    Personal Factors and Comorbidities Comorbidity 1    Comorbidities Hyperlipidemia    Examination-Activity Limitations Sleep;Bed Mobility;Bend;Stand;Transfers    Examination-Participation Restrictions Community Activity;Other   house activity   Stability/Clinical Decision Making Stable/Uncomplicated    Clinical Decision Making Low    Rehab Potential Good    PT Frequency Other (comment)   1-2x/week   PT Duration 8 weeks    PT  Treatment/Interventions ADLs/Self Care Home Management;Cryotherapy;Electrical Stimulation;Iontophoresis 4mg /ml Dexamethasone;Moist Heat;Traction;Balance training;Therapeutic exercise;Therapeutic activities;Functional mobility training;Stair training;DME Instruction;Ultrasound;Neuromuscular re-education;Patient/family education;Passive range of motion;Spinal Manipulations;Dry needling;Joint Manipulations;Taping;Manual techniques    PT Next Visit Plan Review existing HEP knowledge, manual mobilizations/manipulations for mobility gains.    PT Home Exercise Plan [email or web address]    Consulted and Agree with Plan of Care Patient           Patient will benefit from skilled therapeutic intervention in order to improve the following deficits and impairments:  Hypomobility,Pain,Decreased activity tolerance,Difficulty walking,Decreased mobility,Decreased range of motion,Impaired flexibility,Impaired perceived functional ability  Visit Diagnosis: Acute right-sided low back pain without sciatica  Difficulty in walking, not elsewhere classified     Problem List Patient Active Problem List   Diagnosis Date Noted  . Actinic keratosis 09/12/2020  . Diverticular disease 03/28/2020  . Diverticulitis of colon with perforation 11/30/2019  . LLQ pain 11/06/2019  . Diverticulosis 10/09/2019  . Dyslipidemia 08/01/2019  . Prostate cancer [facility]) s/p prostatectomy 08/01/2019  . Osteoarthritis 08/01/2019   Scot Jun, PT, DPT, OCS, ATC 10/01/20  9:11  [facility] [address] [place], [place], [address] Phone: [phone]   Fax:  [phone]  Name: Jackson [name] MRN: [id number] Date of Birth: [birth year]

## 2020-10-21 ENCOUNTER — Other Ambulatory Visit: Payer: Self-pay

## 2020-10-21 ENCOUNTER — Ambulatory Visit: Payer: Medicare Other | Admitting: Rehabilitative and Restorative Service Providers"

## 2020-10-21 ENCOUNTER — Encounter: Payer: Self-pay | Admitting: Rehabilitative and Restorative Service Providers"

## 2020-10-21 DIAGNOSIS — M545 Low back pain, unspecified: Secondary | ICD-10-CM

## 2020-10-21 DIAGNOSIS — R262 Difficulty in walking, not elsewhere classified: Secondary | ICD-10-CM | POA: Diagnosis not present

## 2020-10-21 NOTE — Therapy (Signed)
[facility] Physical Therapy [address] [place], [place], [address] Phone: [phone]   Fax:  [phone]  Physical Therapy Treatment/Discharge  Patient Details  Name: Jackson [name] MRN: [id number] Date of Birth: 04-16-1956 Referring Provider (PT): Dr. Lynne Leader   Encounter Date: 10/21/2020   PHYSICAL THERAPY DISCHARGE SUMMARY  Visits from Start of Care: 2  Current functional level related to goals / functional outcomes: See note   Remaining deficits: See note   Education / Equipment: HEP Plan: Patient agrees to discharge.  Patient goals were met. Patient is being discharged due to meeting the stated rehab goals.  ?????        PT End of Session - 10/21/20 0843    Visit Number 2    Number of Visits 12    Date for PT Re-Evaluation 11/26/20    Authorization Type [employer] Medicare    Progress Note Due on Visit 10    PT Start Time 0842    PT Stop Time 0906    PT Time Calculation (min) 24 min    Activity Tolerance Patient tolerated treatment well    Behavior During Therapy [facility] for tasks assessed/performed           Past Medical History:  Diagnosis Date  . Anginal pain ([place]) 2019   heat stress. stress test was negative  . Arthritis   . Diverticulitis   . Diverticulosis   . HLD (hyperlipidemia)   . Prostate cancer [facility])     Past Surgical History:  Procedure Laterality Date  . CHOLECYSTECTOMY  05/18/2007  . CYSTOSCOPY WITH STENT PLACEMENT N/A 03/28/2020   Procedure: FIREFLY INJECTION;  Surgeon: Ardis Hughs, MD;  Location: [facility];  Service: Urology;  Laterality: N/A;  . EYE SURGERY    . PROSTATECTOMY  2011  . REFRACTIVE SURGERY Bilateral    eyes  . TONSILLECTOMY      There were no vitals filed for this visit.   Subjective Assessment - 10/21/20 0847    Subjective Pt. indicated pain at worst 2/10.  Pt. stated no restriction in activity at this  point due to symptoms.    Limitations Standing;House hold activities    Diagnostic  tests Lumbar xray showing DDD L5-S1    Patient Stated Goals Reduce pain, improve flexibility    Currently in Pain? No/denies    Pain Score 2    at worst   Pain Location Back    Pain Onset More than a month ago              [facility] PT Assessment - 10/21/20 0001      Assessment   Medical Diagnosis Low back pain    Referring Provider (PT) Dr. Lynne Leader    Onset Date/Surgical Date 08/15/20    Hand Dominance Right      Observation/Other Assessments   Focus on Therapeutic Outcomes (FOTO)  update 94%      AROM   Lumbar Flexion to ankles c hamstring tightness    Lumbar Extension 50 % with tightness indicated    Lumbar - Right Side Bend movement to lateral knee joint line    Lumbar - Left Side Bend movement to lateral knee joint, no pain                         [facility] Adult PT Treatment/Exercise - 10/21/20 0001      Exercises   Other Exercises  Time spent in HEP review for d/c.  Lumbar Exercises: Stretches   Lower Trunk Rotation 3 reps   3 x 15 seconds bilateral   Other Lumbar Stretch Exercise standing lumbar extension x 10 c review for HEP      Lumbar Exercises: Supine   Bridge 15 reps;Compliant                    PT Short Term Goals - 10/21/20 0903      PT SHORT TERM GOAL #1   Title Patient will demonstrate independent use of home exercise program to maintain progress from in clinic treatments.    Time 3    Period Weeks    Status Achieved    Target Date 10/22/20             PT Long Term Goals - 10/21/20 0903      PT LONG TERM GOAL #1   Title Patient will demonstrate/report pain at worst less than or equal to 2/10 to facilitate minimal limitation in daily activity secondary to pain symptoms.    Time 8    Period Weeks    Status Achieved      PT LONG TERM GOAL #2   Title Patient will demonstrate independent use of home exercise program to facilitate ability to maintain/progress functional gains from skilled physical therapy services.     Time 8    Period Weeks    Status Achieved      PT LONG TERM GOAL #3   Title Pt. will demonstrate FOTO outcome > or = 79 % to indicated reduced disabilty due to condition.    Time 8    Period Weeks    Status Achieved      PT LONG TERM GOAL #4   Title Patient will demonstrate lumbar extension 100 % WFL s symptoms to facilitate upright standing, walking posture at PLOF s limitation.    Time 8    Period Weeks    Status Partially Met      PT LONG TERM GOAL #5   Title Pt. will demonstrate/report ability to sleep s restriction.    Time 8    Period Weeks    Status Achieved                  Patient will benefit from skilled therapeutic intervention in order to improve the following deficits and impairments:     Visit Diagnosis: Acute right-sided low back pain without sciatica  Difficulty in walking, not elsewhere classified     Problem List Patient Active Problem List   Diagnosis Date Noted  . Actinic keratosis 09/12/2020  . Diverticular disease 03/28/2020  . Diverticulitis of colon with perforation 11/30/2019  . LLQ pain 11/06/2019  . Diverticulosis 10/09/2019  . Dyslipidemia 08/01/2019  . Prostate cancer [facility]) s/p prostatectomy 08/01/2019  . Osteoarthritis 08/01/2019    Scot Jun, PT, DPT, OCS, ATC 10/21/20  9:05 AM     [facility] Physical Therapy [address] [place], [place], [address] Phone: [phone]   Fax:  [phone]  Name: Kaire Stary MRN: [id number] Date of Birth: 01-Mar-1956

## 2020-10-23 ENCOUNTER — Encounter: Payer: Medicare Other | Admitting: Rehabilitative and Restorative Service Providers"

## 2020-10-28 ENCOUNTER — Encounter: Payer: Medicare Other | Admitting: Rehabilitative and Restorative Service Providers"

## 2020-10-30 ENCOUNTER — Encounter: Payer: Medicare Other | Admitting: Rehabilitative and Restorative Service Providers"

## 2020-10-30 NOTE — Progress Notes (Signed)
I, Wendy Poet, LAT, ATC, am serving as scribe for Dr. Lynne Leader.  Jackson [name] is a 65 y.o. male who presents to [place] [facility] today for f/u of low back pain.  He was last seen by Dr. Georgina Snell on 09/19/20 and was referred to PT of which he's completed 2 visits and has been d/c.  He was also prescribed Tizanidine.  Since his last visit, pt reports that his low back pain is much improved.  He has been doing his HEP as prescribed by PT.  He reports minimal pain in the morning but then pain subsides once he's up and moving.  Diagnostic imaging: L-spine XR- 09/19/20; R hip XR- 09/12/20  Pertinent review of systems: No fevers or chills  Relevant historical information: History of prostate cancer status post prostatectomy.   Exam:  BP 120/70 (BP Location: Right Arm, Patient Position: Sitting, Cuff Size: Normal)   Pulse (!) 59   Ht 5' 9.5" (1.765 m)   Wt 190 lb 12.8 oz (86.5 kg)   SpO2 97%   BMI 27.77 kg/m  General: Well Developed, well nourished, and in no acute distress.   MSK: L-spine normal lumbar motion normal gait.    Lab and Radiology Results  DG Lumbar Spine 2-3 Views  Result Date: 09/22/2020 CLINICAL DATA:  Low back pain for 1 month which radiates into the right hip. EXAM: LUMBAR SPINE - 2-3 VIEW COMPARISON:  None. FINDINGS: The alignment of the lumbar spine appears normal. The vertebral body heights are well preserved. Mild multilevel disc space narrowing is most advanced at L4-5 and L5-S1. Multilevel endplate spurring noted. L4-5 facet arthropathy. Cholecystectomy clips noted IMPRESSION: 1. No acute findings. 2. Lumbar degenerative disc disease and facet arthropathy. Electronically Signed   By: Kerby Moors M.D.   On: 09/22/2020 10:34   DG HIP UNILAT WITH PELVIS 2-3 VIEWS RIGHT  Result Date: 09/14/2020 CLINICAL DATA:  Hip pain. EXAM: DG HIP (WITH OR WITHOUT PELVIS) 2-3V RIGHT COMPARISON:  None. FINDINGS: No fracture or dislocation identified. No joint  space narrowing in the hips. Lower lumbar facet degenerative changes. The right SI joint appears narrow compared to the left. A CT scan from June of 2021 demonstrated partial fusion of the right SI joint. No other abnormalities. IMPRESSION: 1. 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 2. Degenerative changes in the lower lumbar spine. 3. No other abnormalities. Electronically Signed   By: Dorise Bullion III M.D   On: 09/14/2020 16:38   I, Lynne Leader, personally (independently) visualized and performed the interpretation of the images attached in this note.      Assessment and Plan: 65 y.o. male with low back pain acute pain now resolved with a little bit of physical therapy.  Mild chronic right low back pain still mildly present.  This is probably due to the partial fusion right SI joint seen on x-ray and CT scan.  Plan for continued home exercise program and watchful waiting.  Recheck as needed.  Reviewed the x-ray findings with patient and discussed the question of the partial fusion right SI joint. Total encounter time 20 minutes including face-to-face time with the patient and, reviewing past medical record, and charting on the date of service.      Discussed warning signs or symptoms. Please see discharge instructions. Patient expresses understanding.   The above documentation has been reviewed and is accurate and complete Lynne Leader, M.D.

## 2020-10-31 ENCOUNTER — Other Ambulatory Visit: Payer: Self-pay

## 2020-10-31 ENCOUNTER — Encounter: Payer: Self-pay | Admitting: Family Medicine

## 2020-10-31 ENCOUNTER — Ambulatory Visit: Payer: Medicare Other | Admitting: Family Medicine

## 2020-10-31 VITALS — BP 120/70 | HR 59 | Ht 69.5 in | Wt 190.8 lb

## 2020-10-31 DIAGNOSIS — M545 Low back pain, unspecified: Secondary | ICD-10-CM | POA: Diagnosis not present

## 2020-10-31 NOTE — Patient Instructions (Signed)
Thank you for coming in today.   Continue home exercises.   Recheck with me as needed.   I can re-order PT in the future if needed.

## 2020-11-04 ENCOUNTER — Encounter: Payer: Medicare Other | Admitting: Rehabilitative and Restorative Service Providers"

## 2020-11-06 ENCOUNTER — Encounter: Payer: Medicare Other | Admitting: Rehabilitative and Restorative Service Providers"

## 2020-11-08 ENCOUNTER — Other Ambulatory Visit: Payer: Self-pay | Admitting: Family Medicine

## 2020-11-08 DIAGNOSIS — E785 Hyperlipidemia, unspecified: Secondary | ICD-10-CM

## 2020-11-11 ENCOUNTER — Encounter: Payer: Medicare Other | Admitting: Rehabilitative and Restorative Service Providers"

## 2020-11-13 ENCOUNTER — Encounter: Payer: Medicare Other | Admitting: Rehabilitative and Restorative Service Providers"

## 2020-11-18 ENCOUNTER — Encounter: Payer: Medicare Other | Admitting: Rehabilitative and Restorative Service Providers"

## 2020-11-25 ENCOUNTER — Encounter: Payer: Medicare Other | Admitting: Rehabilitative and Restorative Service Providers"

## 2020-12-02 ENCOUNTER — Encounter: Payer: Medicare Other | Admitting: Rehabilitative and Restorative Service Providers"

## 2020-12-09 ENCOUNTER — Encounter: Payer: Medicare Other | Admitting: Rehabilitative and Restorative Service Providers"

## 2021-02-01 ENCOUNTER — Other Ambulatory Visit: Payer: Self-pay | Admitting: Family Medicine

## 2021-02-01 DIAGNOSIS — E785 Hyperlipidemia, unspecified: Secondary | ICD-10-CM

## 2021-04-10 IMAGING — CT CT ABD-PELV W/ CM
2 of 5 series · 15 of 46 positions shown, 17 images · IV contrast (omnipaque)
Comparison: None.

CLINICAL DATA: Left lower quadrant pain for 2 months. Diarrhea and
constipation. History of diverticulosis.

EXAM:
CT ABDOMEN AND PELVIS WITH CONTRAST
TECHNIQUE: Multidetector CT imaging of the abdomen and pelvis was performed
using the standard protocol following bolus administration of
intravenous contrast.
CONTRAST:  100mL OMNIPAQUE IOHEXOL 300 MG/ML  SOLN

[Series 2: axial st · axial · 0.76mm/px · z∈[-515,-115]mm · 12 of 92 slices shown, 14 images]
[im 6/92  soft-tissue]
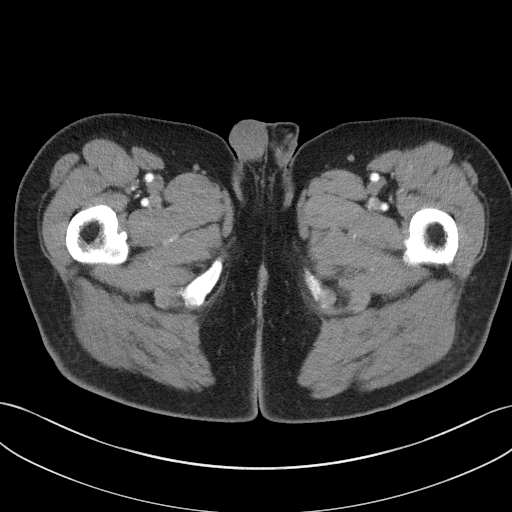
[im 6/92  bone]
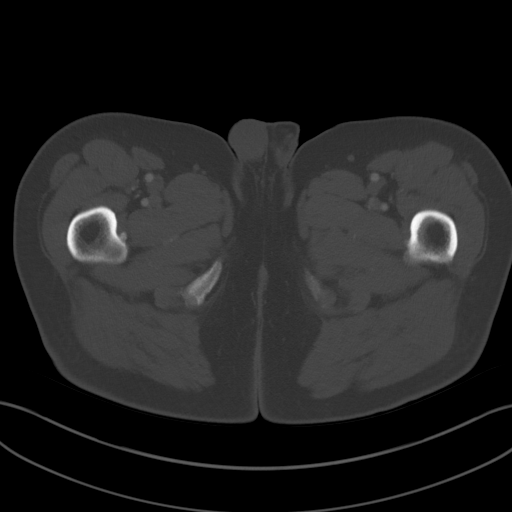
[im 12/92  soft-tissue]
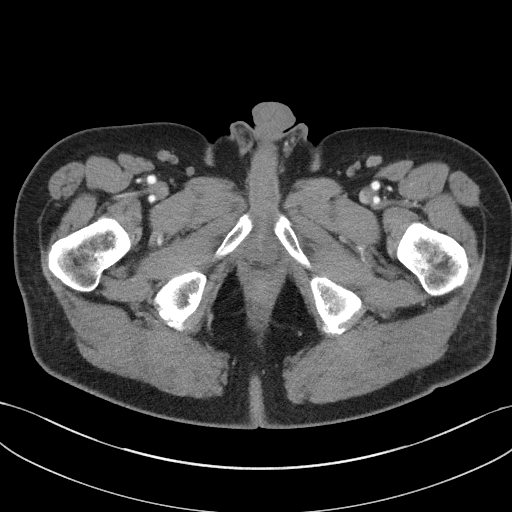
[im 23/92  soft-tissue]
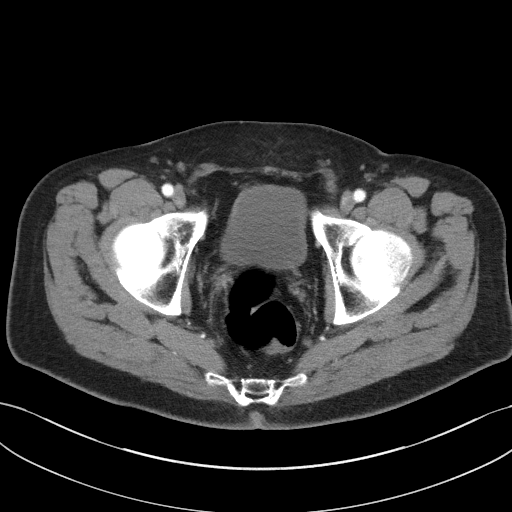
[im 29/92  soft-tissue]
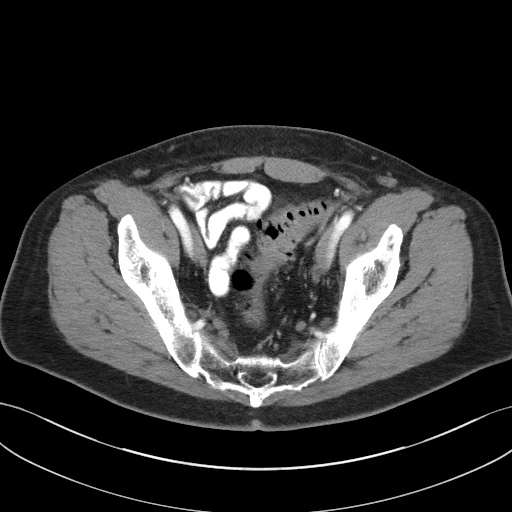
[im 35/92  soft-tissue]
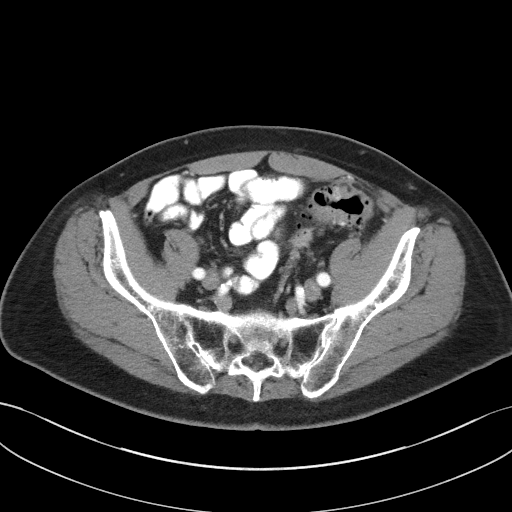
[im 40/92  soft-tissue]
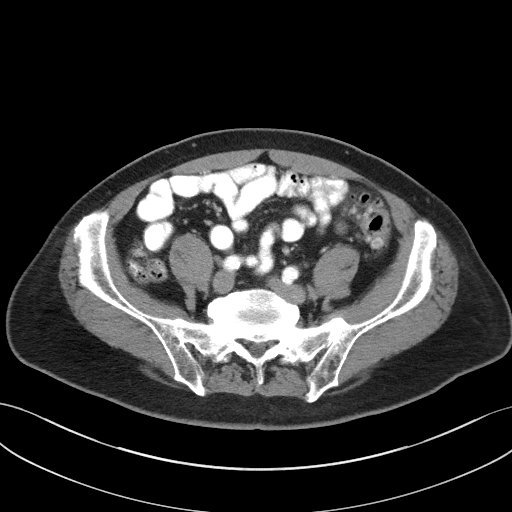
[im 52/92  soft-tissue]
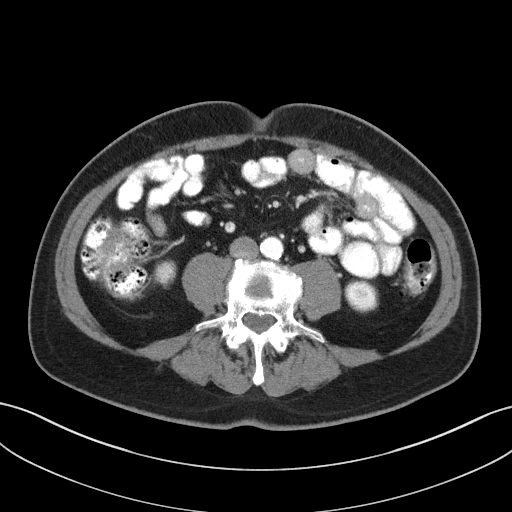
[im 57/92  soft-tissue]
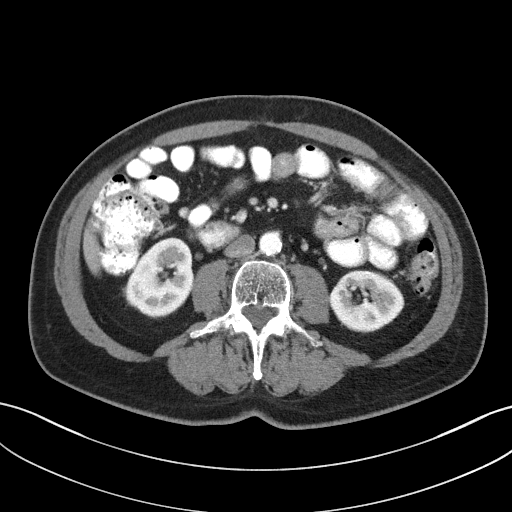
[im 63/92  soft-tissue]
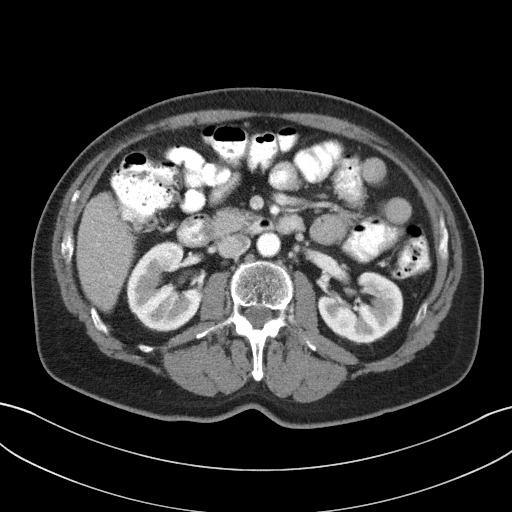
[im 63/92  bone]
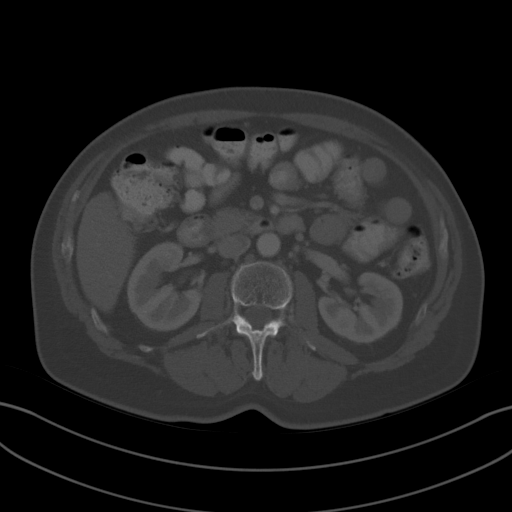
[im 69/92  soft-tissue]
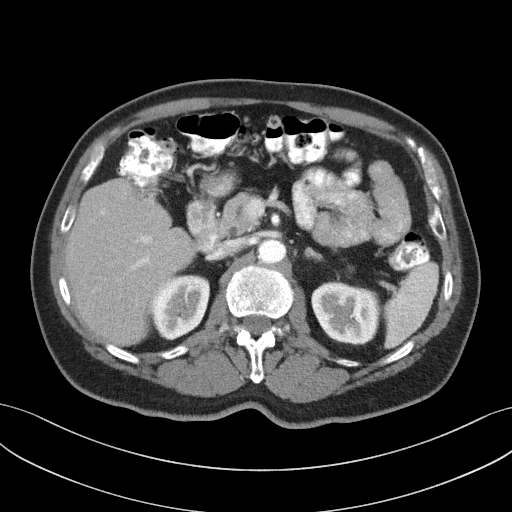
[im 80/92  soft-tissue]
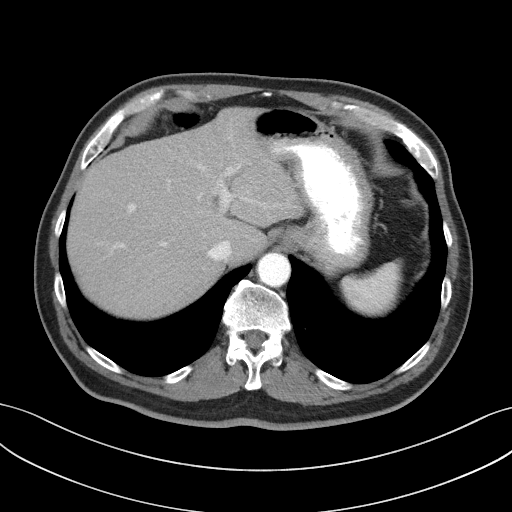
[im 86/92  soft-tissue]
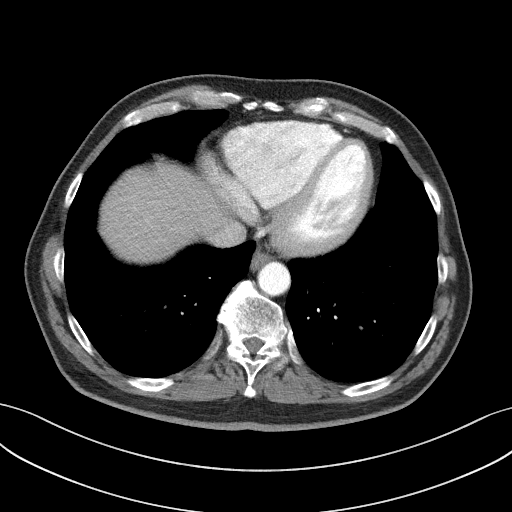

[Series 4: coronal st · coronal · 0.83mm/px · 3 of 89 slices shown]
[im 30/89  soft-tissue]
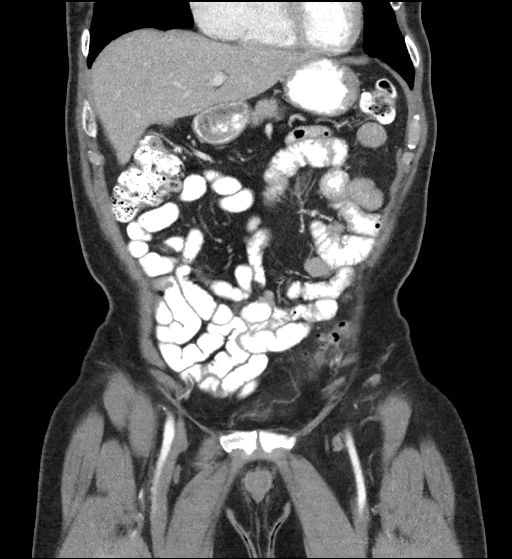
[im 40/89  soft-tissue]
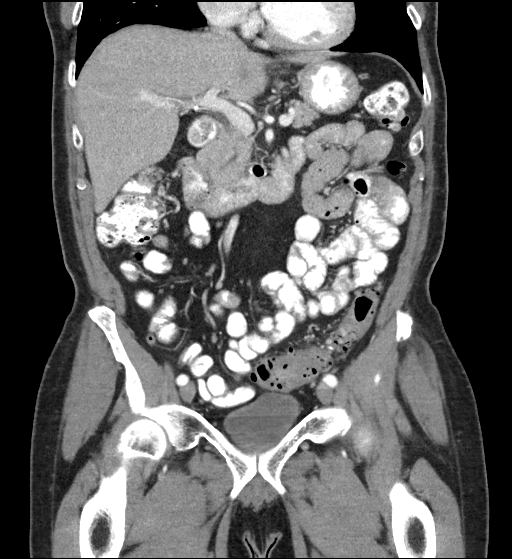
[im 49/89  soft-tissue]
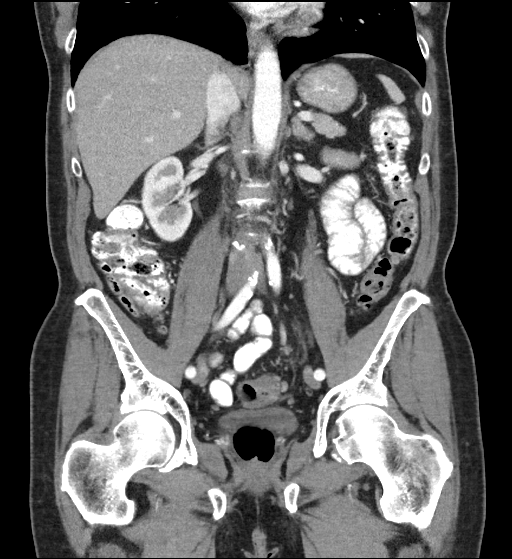

[15 of 46 positions shown; findings below may reference images not displayed]

FINDINGS: Lower chest: Unremarkable.

Hepatobiliary: 8 mm hypoattenuating lesion subcapsular aspect of the
medial right liver is too small to characterize. Coronal appearance
on image 57/4 is highly suggestive of a cyst. Additional tiny 3-4
mm hypodensities in the dome of the left liver and inferior right
liver are much too small to characterize, but likely benign
statistically. Gallbladder is surgically absent. No intrahepatic or
extrahepatic biliary dilation.

Pancreas: No focal mass lesion. No dilatation of the main duct. No
intraparenchymal cyst. No peripancreatic edema.

Spleen: No splenomegaly. No focal mass lesion.

Adrenals/Urinary Tract: No adrenal nodule or mass. Kidneys
unremarkable. No evidence for hydroureter. The urinary bladder
appears normal for the degree of distention.

Stomach/Bowel: Stomach is unremarkable. No gastric wall thickening.
No evidence of outlet obstruction. Duodenum is normally positioned
as is the ligament of Treitz. Tiny duodenal diverticulum noted. No
small bowel wall thickening. No small bowel dilatation. The terminal
ileum is normal. The appendix is normal. No gross colonic mass. No
colonic wall thickening. Diffuse diverticular change noted in the
colon, most advanced in the descending and sigmoid segments. There
is some subtle edema/inflammation associated with the distal sigmoid
segment and associated wall thickening is noted. Immediately cranial
to the proximal to mid sigmoid colon is a 2.0 x 2.3 x 2.1 cm
irregular soft tissue structure containing several tiny gas locules
(axial image 59/series 2 or coronal 45/4).

Vascular/Lymphatic: There is abdominal aortic atherosclerosis
without aneurysm. There is no gastrohepatic or hepatoduodenal
ligament lymphadenopathy. No retroperitoneal or mesenteric
lymphadenopathy. No pelvic sidewall lymphadenopathy.

Reproductive: Prostatectomy.

Other: No intraperitoneal free fluid.

Musculoskeletal: No worrisome lytic or sclerotic osseous
abnormality. Mild superior endplate compression deformity noted at
L4.
IMPRESSION: 1. Diffuse diverticular change in the colon, most advanced in the
descending and sigmoid segments. There is some subtle
edema/inflammation associated with the distal sigmoid segment and
immediately cranial to the proximal to mid sigmoid colon is a 2.0 x
2.3 x 2.1 cm irregular soft tissue structure containing several tiny
gas locules. Imaging features are most likely sequelae of
age-indeterminate diverticulitis. I do not think this is a dominant
irregular diverticulum, but is more likely either a chronic abscess
pocket or acute focal area of pericolonic inflammation. There is no
fluid in this structure and there is no percutaneously drainable
abscess at this time. Additionally, correlation with colorectal
cancer screening history recommended.
2. Tiny hypoattenuating liver lesions, too small to characterize,
but likely benign.
3. Prostatectomy.
4. Aortic Atherosclerosis ([id number]).

These results will be called to the ordering clinician or
representative by the Radiologist Assistant, and communication
documented in the PACS or [REDACTED].

## 2021-05-04 ENCOUNTER — Other Ambulatory Visit: Payer: Self-pay | Admitting: Family Medicine

## 2021-05-04 DIAGNOSIS — E785 Hyperlipidemia, unspecified: Secondary | ICD-10-CM

## 2021-08-02 ENCOUNTER — Other Ambulatory Visit: Payer: Self-pay | Admitting: Family Medicine

## 2021-08-02 DIAGNOSIS — E785 Hyperlipidemia, unspecified: Secondary | ICD-10-CM

## 2021-08-18 ENCOUNTER — Ambulatory Visit (INDEPENDENT_AMBULATORY_CARE_PROVIDER_SITE_OTHER): Payer: Medicare Other | Admitting: Family Medicine

## 2021-08-18 ENCOUNTER — Encounter: Payer: Self-pay | Admitting: Family Medicine

## 2021-08-18 VITALS — BP 134/80 | HR 57 | Temp 97.7°F | Ht 69.5 in | Wt 207.4 lb

## 2021-08-18 DIAGNOSIS — R739 Hyperglycemia, unspecified: Secondary | ICD-10-CM | POA: Diagnosis not present

## 2021-08-18 DIAGNOSIS — Z0001 Encounter for general adult medical examination with abnormal findings: Secondary | ICD-10-CM | POA: Diagnosis not present

## 2021-08-18 DIAGNOSIS — E785 Hyperlipidemia, unspecified: Secondary | ICD-10-CM

## 2021-08-18 DIAGNOSIS — K5790 Diverticulosis of intestine, part unspecified, without perforation or abscess without bleeding: Secondary | ICD-10-CM

## 2021-08-18 DIAGNOSIS — Z23 Encounter for immunization: Secondary | ICD-10-CM

## 2021-08-18 DIAGNOSIS — M1990 Unspecified osteoarthritis, unspecified site: Secondary | ICD-10-CM

## 2021-08-18 DIAGNOSIS — C61 Malignant neoplasm of prostate: Secondary | ICD-10-CM | POA: Diagnosis not present

## 2021-08-18 LAB — CBC
HCT: 42.2 % (ref 39.0–52.0)
Hemoglobin: 13.7 g/dL (ref 13.0–17.0)
MCHC: 32.6 g/dL (ref 30.0–36.0)
MCV: 86.2 fl (ref 78.0–100.0)
Platelets: 253 10*3/uL (ref 150.0–400.0)
RBC: 4.9 Mil/uL (ref 4.22–5.81)
RDW: 13.5 % (ref 11.5–15.5)
WBC: 7.7 10*3/uL (ref 4.0–10.5)

## 2021-08-18 LAB — LIPID PANEL
Cholesterol: 134 mg/dL (ref 0–200)
HDL: 42.3 mg/dL (ref >39.00)
LDL Cholesterol: 56 mg/dL (ref 0–99)
NonHDL: 91.25
Total CHOL/HDL Ratio: 3
Triglycerides: 174 mg/dL — ABNORMAL HIGH (ref 0.0–149.0)
VLDL: 34.8 mg/dL (ref 0.0–40.0)

## 2021-08-18 LAB — COMPREHENSIVE METABOLIC PANEL
ALT: 16 U/L (ref 0–53)
AST: 18 U/L (ref 0–37)
Albumin: 4.7 g/dL (ref 3.5–5.2)
Alkaline Phosphatase: 97 U/L (ref 39–117)
BUN: 11 mg/dL (ref 6–23)
CO2: 29 mEq/L (ref 19–32)
Calcium: 9.9 mg/dL (ref 8.4–10.5)
Chloride: 107 mEq/L (ref 96–112)
Creatinine, Ser: 0.81 mg/dL (ref 0.40–1.50)
GFR: 91.97 mL/min (ref >60.00)
Glucose, Bld: 104 mg/dL — ABNORMAL HIGH (ref 70–99)
Potassium: 4.5 mEq/L (ref 3.5–5.1)
Sodium: 144 mEq/L (ref 135–145)
Total Bilirubin: 0.5 mg/dL (ref 0.2–1.2)
Total Protein: 7.1 g/dL (ref 6.0–8.3)

## 2021-08-18 LAB — HEMOGLOBIN A1C: Hgb A1c MFr Bld: 6 % (ref 4.6–6.5)

## 2021-08-18 LAB — PSA: PSA: 0.02 ng/mL — ABNORMAL LOW (ref 0.10–4.00)

## 2021-08-18 LAB — TSH: TSH: 1.08 u[IU]/mL (ref 0.35–5.50)

## 2021-08-18 NOTE — Patient Instructions (Signed)
It was very nice to see you today! ? ?Keep up the good work! ? ?We gave  you your pneumonia vaccine today. ? ?We will check blood work. ? ?Please come back in 1 year for your next physical.  Come back sooner if needed. ? ?Take care, ?Dr Jerline Pain ? ?PLEASE NOTE: ? ?If you had any lab tests please let us know if you have not heard back within a few days. You may see your results on mychart before we have a chance to review them but we will give you a call once they are reviewed by [place]. If we ordered any referrals today, please let us know if you have not heard from their office within the next week.  ? ?Please try these tips to maintain a healthy lifestyle: ? ?Eat at least 3 REAL meals and 1-2 snacks per day.  Aim for no more than 5 hours between eating.  If you eat breakfast, please do so within one hour of getting up.  ? ?Each meal should contain half fruits/vegetables, one quarter protein, and one quarter carbs (no bigger than a computer mouse) ? ?Cut down on sweet beverages. This includes juice, soda, and sweet tea.  ? ?Drink at least 1 glass of water with each meal and aim for at least 8 glasses per day ? ?Exercise at least 150 minutes every week.   ? ?Preventive Care 40 Years and Older, Male ?Preventive care refers to lifestyle choices and visits with your health care provider that can promote health and wellness. Preventive care visits are also called wellness exams. ?What can I expect for my preventive care visit? ?Counseling ?During your preventive care visit, your health care provider may ask about your: ?Medical history, including: ?Past medical problems. ?Family medical history. ?History of falls. ?Current health, including: ?Emotional well-being. ?Home life and relationship well-being. ?Sexual activity. ?Memory and ability to understand (cognition). ?Lifestyle, including: ?Alcohol, nicotine or tobacco, and drug use. ?Access to firearms. ?Diet, exercise, and sleep habits. ?Work and work  Statistician. ?Sunscreen use. ?Safety issues such as seatbelt and bike helmet use. ?Physical exam ?Your health care provider will check your: ?Height and weight. These may be used to calculate your BMI (body mass index). BMI is a measurement that tells if you are at a healthy weight. ?Waist circumference. This measures the distance around your waistline. This measurement also tells if you are at a healthy weight and may help predict your risk of certain diseases, such as type 2 diabetes and high blood pressure. ?Heart rate and blood pressure. ?Body temperature. ?Skin for abnormal spots. ?What immunizations do I need? ?Vaccines are usually given at various ages, according to a schedule. Your health care provider will recommend vaccines for you based on your age, medical history, and lifestyle or other factors, such as travel or where you work. ?What tests do I need? ?Screening ?Your health care provider may recommend screening tests for certain conditions. This may include: ?Lipid and cholesterol levels. ?Diabetes screening. This is done by checking your blood sugar (glucose) after you have not eaten for a while (fasting). ?Hepatitis C test. ?Hepatitis B test. ?HIV (human immunodeficiency virus) test. ?STI (sexually transmitted infection) testing, if you are at risk. ?Lung cancer screening. ?Colorectal cancer screening. ?Prostate cancer screening. ?Abdominal aortic aneurysm (AAA) screening. You may need this if you are a current or former smoker. ?Talk with your health care provider about your test results, treatment options, and if necessary, the need for more tests. ?Follow these instructions  at home: ?Eating and drinking ? ?Eat a diet that includes fresh fruits and vegetables, whole grains, lean protein, and low-fat dairy products. Limit your intake of foods with high amounts of sugar, saturated fats, and salt. ?Take vitamin and mineral supplements as recommended by your health care provider. ?Do not drink alcohol  if your health care provider tells you not to drink. ?If you drink alcohol: ?Limit how much you have to 0-2 drinks a day. ?Know how much alcohol is in your drink. In the [place] one drink equals one 12 oz bottle of beer (355 mL), one 5 oz glass of wine (148 mL), or one 1? oz glass of hard liquor (44 mL). ?Lifestyle ?Brush your teeth every morning and night with fluoride toothpaste. Floss one time each day. ?Exercise for at least 30 minutes 5 or more days each week. ?Do not use any products that contain nicotine or tobacco. These products include cigarettes, chewing tobacco, and vaping devices, such as e-cigarettes. If you need help quitting, ask your health care provider. ?Do not use drugs. ?If you are sexually active, practice safe sex. Use a condom or other form of protection to prevent STIs. ?Take aspirin only as told by your health care provider. Make sure that you understand how much to take and what form to take. Work with your health care provider to find out whether it is safe and beneficial for you to take aspirin daily. ?Ask your health care provider if you need to take a cholesterol-lowering medicine (statin). ?Find healthy ways to manage stress, such as: ?Meditation, yoga, or listening to music. ?Journaling. ?Talking to a trusted person. ?Spending time with friends and family. ?Safety ?Always wear your seat belt while driving or riding in a vehicle. ?Do not drive: ?If you have been drinking alcohol. Do not ride with someone who has been drinking. ?When you are tired or distracted. ?While texting. ?If you have been using any mind-altering substances or drugs. ?Wear a helmet and other protective equipment during sports activities. ?If you have firearms in your house, make sure you follow all gun safety procedures. ?Minimize exposure to UV radiation to reduce your risk of skin cancer. ?What's next? ?Visit your health care provider once a year for an annual wellness visit. ?Ask your health care provider how  often you should have your eyes and teeth checked. ?Stay up to date on all vaccines. ?This information is not intended to replace advice given to you by your health care provider. Make sure you discuss any questions you have with your health care provider. ?Document Revised: 10/29/2020 Document Reviewed: 10/29/2020 ?Elsevier Patient Education ? [place]. ? ?

## 2021-08-18 NOTE — Assessment & Plan Note (Signed)
Stable using over-the-counter medications as needed. ?

## 2021-08-18 NOTE — Assessment & Plan Note (Signed)
Check lipids.  Continue Lipitor 40 mg daily. °

## 2021-08-18 NOTE — Progress Notes (Signed)
? ?Chief Complaint:  ?Jackson [name] is a 66 y.o. male who presents today for his annual comprehensive physical exam.   ? ?Assessment/Plan:  ?Chronic Problems Addressed Today: ?Prostate cancer [facility]) s/p prostatectomy ?Check PSA.  ? ?Dyslipidemia ?Check lipids.  Continue Lipitor 40 mg daily. ? ?Diverticular disease s/p robotic sigmoidectomy 2021 ?Doing well post operatively.  ? ?Osteoarthritis ?Stable using over-the-counter medications as needed. ? ?Preventative Healthcare: ?Check labs. Pneumonia vaccine given today. UTD on shingle and Covid vaccine. Due for colonoscopy 2031. ? ?Patient Counseling(The following topics were reviewed and/or handout was given): ? -Nutrition: Stressed importance of moderation in sodium/caffeine intake, saturated fat and cholesterol, caloric balance, sufficient intake of fresh fruits, vegetables, and fiber. ? -Stressed the importance of regular exercise.  ? -Substance Abuse: Discussed cessation/primary prevention of tobacco, alcohol, or other drug use; driving or other dangerous activities under the influence; availability of treatment for abuse.  ? -Injury prevention: Discussed safety belts, safety helmets, smoke detector, smoking near bedding or upholstery.  ? -Sexuality: Discussed sexually transmitted diseases, partner selection, use of condoms, avoidance of unintended pregnancy and contraceptive alternatives.  ? -Dental health: Discussed importance of regular tooth brushing, flossing, and dental visits. ? -Health maintenance and immunizations reviewed. Please refer to Health maintenance section. ? ?Return to care in 1 year for next preventative visit.  ? ?  ?Subjective:  ?HPI: ? ?He has no acute complaints today.  ? ?Patient had laparoscopic sigmoidectomy in November 2021. He has recovered well. He has not had any issue since then. He notes bowel movents has been normal. Denies diarrhea. No reported nausea or vomiting. Denies loose stools. No abdominal pain.  ? ?Lifestyle ?Diet:  Balanced.  ?Exercise: None specific.  ? ? ?  08/18/2021  ?  8:20 AM  ?Depression screen PHQ 2/9  ?Decreased Interest 0  ?Down, Depressed, Hopeless 0  ?PHQ - 2 Score 0  ? ? ?Health Maintenance Due  ?Topic Date Due  ? Hepatitis C Screening  Never done  ?  ? ?ROS: Per HPI, otherwise a complete review of systems was negative.  ? ?PMH: ? ?The following were reviewed and entered/updated in epic: ?Past Medical History:  ?Diagnosis Date  ? Anginal pain ([place]) 2019  ? heat stress. stress test was negative  ? Arthritis   ? Diverticulitis   ? Diverticulosis   ? HLD (hyperlipidemia)   ? Prostate cancer ([place])   ? ?Patient Active Problem List  ? Diagnosis Date Noted  ? Actinic keratosis 09/12/2020  ? Diverticular disease s/p robotic sigmoidectomy 2021 03/28/2020  ? Dyslipidemia 08/01/2019  ? Prostate cancer [facility]) s/p prostatectomy 08/01/2019  ? Osteoarthritis 08/01/2019  ? ?Past Surgical History:  ?Procedure Laterality Date  ? CHOLECYSTECTOMY  05/18/2007  ? CYSTOSCOPY WITH STENT PLACEMENT N/A 03/28/2020  ? Procedure: FIREFLY INJECTION;  Surgeon: Ardis Hughs, MD;  Location: [facility];  Service: Urology;  Laterality: N/A;  ? EYE SURGERY    ? PROSTATECTOMY  2011  ? REFRACTIVE SURGERY Bilateral   ? eyes  ? TONSILLECTOMY    ? ? ?Family History  ?Problem Relation Age of Onset  ? Lung cancer Mother   ? Depression Mother   ? Diabetes Brother   ? Alcohol abuse Brother   ? Depression Brother   ? Early death Father   ? Arthritis Father   ? Alcohol abuse Son   ? Cancer Maternal Grandfather   ?     type unknown  ? Colon cancer Neg Hx   ? Esophageal cancer Neg  Hx   ? Rectal cancer Neg Hx   ? Stomach cancer Neg Hx   ? ? ?Medications- reviewed and updated ?Current Outpatient Medications  ?Medication Sig Dispense Refill  ? acetaminophen (TYLENOL) 500 MG tablet Take 1,000 mg by mouth every 6 (six) hours as needed.    ? atorvastatin (LIPITOR) 40 MG tablet TAKE 1 TABLET BY MOUTH EVERY DAY 90 tablet 0  ? FIBER SELECT GUMMIES PO Take 2 tablets by  mouth daily.    ? Multiple Vitamins-Minerals (ADULT GUMMY PO) Take 2 tablets by mouth daily.    ? vardenafil (LEVITRA) 20 MG tablet Take 0.5-1 tablets (10-20 mg total) by mouth daily as needed for erectile dysfunction. (Patient taking differently: Take 10 mg by mouth daily as needed for erectile dysfunction.) 10 tablet 0  ? Green Tea, Camellia sinensis, (GREEN TEA PO) Take 315 mg by mouth daily as needed (energy).  (Patient not taking: Reported on 08/18/2021)    ? ?No current facility-administered medications for this visit.  ? ? ?Allergies-reviewed and updated ?No Known Allergies ? ?Social History  ? ?Socioeconomic History  ? Marital status: Married  ?  Spouse name: Not on file  ? Number of children: 2  ? Years of education: Not on file  ? Highest education level: Not on file  ?Occupational History  ? Occupation: retired  ?Tobacco Use  ? Smoking status: Never  ? Smokeless tobacco: Never  ?Vaping Use  ? Vaping Use: Never used  ?Substance and Sexual Activity  ? Alcohol use: Yes  ?  Comment: beer every 6 months  ? Drug use: Never  ? Sexual activity: Not on file  ?Other Topics Concern  ? Not on file  ?Social History Narrative  ? Not on file  ? ?Social Determinants of Health  ? ?Financial Resource Strain: Not on file  ?Food Insecurity: Not on file  ?Transportation Needs: Not on file  ?Physical Activity: Not on file  ?Stress: Not on file  ?Social Connections: Not on file  ? ?   ?  ?Objective:  ?Physical Exam: ?BP 134/80 (BP Location: Left Arm)   Pulse (!) 57   Temp 97.7 ?F (36.5 ?C) (Temporal)   Ht 5' 9.5" (1.765 m)   Wt 207 lb 6.4 oz (94.1 kg)   SpO2 97%   BMI 30.19 kg/m?   ?Body mass index is 30.19 kg/m?. ?Wt Readings from Last 3 Encounters:  ?08/18/21 207 lb 6.4 oz (94.1 kg)  ?10/31/20 190 lb 12.8 oz (86.5 kg)  ?09/19/20 194 lb 6.4 oz (88.2 kg)  ? ?Gen: NAD, resting comfortably ?HEENT: TMs normal bilaterally. OP clear. No thyromegaly noted.  ?CV: RRR with no murmurs appreciated ?Pulm: NWOB, CTAB with no crackles,  wheezes, or rhonchi ?GI: Normal bowel sounds present. Soft, Nontender, Nondistended. ?MSK: no edema, cyanosis, or clubbing noted ?Skin: warm, dry ?Neuro: CN2-12 grossly intact. Strength 5/5 in upper and lower extremities. Reflexes symmetric and intact bilaterally.  ?Psych: Normal affect and thought content ?   ? ? ?I,Savera Zaman,acting as a Education administrator for Dimas Chyle, MD.,have documented all relevant documentation on the behalf of Dimas Chyle, MD,as directed by  Dimas Chyle, MD while in the presence of Dimas Chyle, MD.  ? ?I, Dimas Chyle, MD, have reviewed all documentation for this visit. The documentation on 08/18/21 for the exam, diagnosis, procedures, and orders are all accurate and complete. ? ?Algis Greenhouse. Jerline Pain, MD ?08/18/2021 8:53 AM  ? ?

## 2021-08-18 NOTE — Assessment & Plan Note (Signed)
Doing well postoperatively

## 2021-08-18 NOTE — Assessment & Plan Note (Signed)
Check PSA. ?

## 2021-08-20 ENCOUNTER — Encounter: Payer: Self-pay | Admitting: Family Medicine

## 2021-08-20 DIAGNOSIS — R7303 Prediabetes: Secondary | ICD-10-CM | POA: Insufficient documentation

## 2021-08-20 DIAGNOSIS — R739 Hyperglycemia, unspecified: Secondary | ICD-10-CM | POA: Insufficient documentation

## 2021-08-20 NOTE — Progress Notes (Signed)
Please inform patient of the following: ? ?His blood sugar is elevated in the prediabetic range.  His cholesterol is also borderline elevated but stable. Everything else is at goal. Do not need to make any changes to treatment plan at this time.  He should continue to work on diet and exercise and we can recheck in year.

## 2021-09-18 ENCOUNTER — Ambulatory Visit (INDEPENDENT_AMBULATORY_CARE_PROVIDER_SITE_OTHER): Payer: Medicare Other

## 2021-09-18 DIAGNOSIS — Z0000 Encounter for general adult medical examination without abnormal findings: Secondary | ICD-10-CM

## 2021-09-18 NOTE — Progress Notes (Addendum)
Virtual Visit via Telephone Note ? ?I connected with  Jackson [name] on 09/18/21 at  3:00 PM EDT by telephone and verified that I am speaking with the correct person using two identifiers. ? ?Medicare Annual Wellness visit completed telephonically due to Covid-19 pandemic.  ? ?Persons participating in this call: This Health Coach and this patient.  ? ?Location: ?Patient: Home ?Provider: office  ?  ?I discussed the limitations, risks, security and privacy concerns of performing an evaluation and management service by telephone and the availability of in person appointments. The patient expressed understanding and agreed to proceed. ? ?Unable to perform video visit due to video visit attempted and failed and/or patient does not have video capability.  ? ?Some vital signs may be absent or patient reported.  ? ?Willette Brace, LPN ? ? ?Subjective:  ? Jackson [name] is a 66 y.o. male who presents for an Initial Medicare Annual Wellness Visit. ? ?Review of Systems    ? ?Cardiac Risk Factors include: advanced age (>23mn, >>17women);dyslipidemia;male gender;obesity (BMI >30kg/m2) ? ?   ?Objective:  ?  ?There were no vitals filed for this visit. ?There is no height or weight on file to calculate BMI. ? ? ?  09/18/2021  ?  3:06 PM 10/01/2020  ?  8:39 AM 03/28/2020  ?  1:44 PM 03/19/2020  ?  9:10 AM  ?Advanced Directives  ?Does Patient Have a Medical Advance Directive? No No No No  ?Would patient like information on creating a medical advance directive? No - Patient declined Yes (MAU/Ambulatory/Procedural Areas - Information given) No - Patient declined No - Patient declined  ? ? ?Current Medications (verified) ?Outpatient Encounter Medications as of 09/18/2021  ?Medication Sig  ? acetaminophen (TYLENOL) 500 MG tablet Take 1,000 mg by mouth every 6 (six) hours as needed.  ? atorvastatin (LIPITOR) 40 MG tablet TAKE 1 TABLET BY MOUTH EVERY DAY  ? FIBER SELECT GUMMIES PO Take 2 tablets by mouth daily.  ? Green Tea, Camellia sinensis,  (GREEN TEA PO) Take 315 mg by mouth daily as needed (energy).  ? Multiple Vitamins-Minerals (ADULT GUMMY PO) Take 2 tablets by mouth daily.  ? vardenafil (LEVITRA) 20 MG tablet Take 0.5-1 tablets (10-20 mg total) by mouth daily as needed for erectile dysfunction. (Patient taking differently: Take 10 mg by mouth daily as needed for erectile dysfunction.)  ? ?No facility-administered encounter medications on file as of 09/18/2021.  ? ? ?Allergies (verified) ?Patient has no known allergies.  ? ?History: ?Past Medical History:  ?Diagnosis Date  ? [place]inal pain [place] 2019  ? heat stress. stress test was negative  ? Arthritis   ? Diverticulitis   ? Diverticulosis   ? HLD (hyperlipidemia)   ? Prostate cancer ([place]   ? ?Past Surgical History:  ?Procedure Laterality Date  ? CHOLECYSTECTOMY  05/18/2007  ? CYSTOSCOPY WITH STENT PLACEMENT N/A 03/28/2020  ? Procedure: FIREFLY INJECTION;  Surgeon: HArdis Hughs MD;  [facility];  Service: Urology;  Laterality: N/A;  ? EYE SURGERY    ? PROSTATECTOMY  2011  ? REFRACTIVE SURGERY Bilateral   ? eyes  ? TONSILLECTOMY    ? ?Family History  ?Problem Relation Age of Onset  ? Lung cancer Mother   ? Depression Mother   ? Diabetes Brother   ? Alcohol abuse Brother   ? Depression Brother   ? Early death Father   ? Arthritis Father   ? Alcohol abuse Son   ? Cancer Maternal Grandfather   ?  type unknown  ? Colon cancer Neg Hx   ? Esophageal cancer Neg Hx   ? Rectal cancer Neg Hx   ? Stomach cancer Neg Hx   ? ?Social History  ? ?Socioeconomic History  ? Marital status: Married  ?  Spouse name: Not on file  ? Number of children: 2  ? Years of education: Not on file  ? Highest education level: Not on file  ?Occupational History  ? Occupation: retired  ?Tobacco Use  ? Smoking status: Never  ? Smokeless tobacco: Never  ?Vaping Use  ? Vaping Use: Never used  ?Substance and Sexual Activity  ? Alcohol use: Yes  ?  Comment: beer every 6 months  ? Drug use: Never  ? Sexual activity: Not on  file  ?Other Topics Concern  ? Not on file  ?Social History Narrative  ? Not on file  ? ?Social Determinants of Health  ? ?Financial Resource Strain: Low Risk   ? Difficulty of Paying Living Expenses: Not hard at all  ?Food Insecurity: No Food Insecurity  ? Worried About Charity fundraiser in the Last Year: Never true  ? Ran Out of Food in the Last Year: Never true  ?Transportation Needs: No Transportation Needs  ? Lack of Transportation (Medical): No  ? Lack of Transportation (Non-Medical): No  ?Physical Activity: Sufficiently Active  ? Days of Exercise per Week: 5 days  ? Minutes of Exercise per Session: 30 min  ?Stress: No Stress Concern Present  ? Feeling of Stress : Not at all  ?Social Connections: Moderately Isolated  ? Frequency of Communication with Friends and Family: Twice a week  ? Frequency of Social Gatherings with Friends and Family: Twice a week  ? Attends Religious Services: Never  ? Active Member of Clubs or Organizations: No  ? Attends Archivist Meetings: Never  ? Marital Status: Married  ? ? ?Tobacco Counseling ?Counseling given: Not Answered ? ? ?Clinical Intake: ? ?Pre-visit preparation completed: Yes ? ?Pain : No/denies pain ? ?  ? ?BMI - recorded: 30.2 ?Nutritional Status: BMI > 30  Obese ?Nutritional Risks: None ?Diabetes: No ? ?How often do you need to have someone help you when you read instructions, pamphlets, or other written materials from your doctor or pharmacy?: 1 - Never ? ?Diabetic?no ? ?Interpreter Needed?: No ? ?Information entered by :: Charlott Rakes, LPN ? ? ?Activities of Daily Living ? ?  09/18/2021  ?  3:07 PM  ?In your present state of health, do you have any difficulty performing the following activities:  ?Hearing? 0  ?Vision? 0  ?Difficulty concentrating or making decisions? 0  ?Walking or climbing stairs? 0  ?Dressing or bathing? 0  ?Doing errands, shopping? 0  ?Preparing Food and eating ? N  ?Using the Toilet? N  ?In the past six months, have you accidently  leaked urine? N  ?Do you have problems with loss of bowel control? N  ?Managing your Medications? N  ?Managing your Finances? N  ?Housekeeping or managing your Housekeeping? N  ? ? ?Patient Care Team: ?Vivi Barrack, MD as PCP - General (Family Medicine) ? ?Indicate any recent Medical Services you may have received from other than [facility] providers in the past year (date may be approximate). ? ?   ?Assessment:  ? This is a routine wellness examination for [place]. ? ?Hearing/Vision screen ?Hearing Screening - Comments:: Pt denies any hearing issues  ?Vision Screening - Comments:: Pt follows up with Dr Macarthur Critchley  for annual eye exams  ? ?Dietary issues and exercise activities discussed: ?Current Exercise Habits: Home exercise routine, Type of exercise: walking, Time (Minutes): 30, Frequency (Times/Week): 5, Weekly Exercise (Minutes/Week): 150 ? ? Goals Addressed   ? ?  ?  ?  ?  ? This Visit's Progress  ?  Patient Stated     ?  Pick up a little more exercise  ?  ? ?  ? ?Depression Screen ? ?  09/18/2021  ?  3:04 PM 08/18/2021  ?  8:20 AM  ?PHQ 2/9 Scores  ?PHQ - 2 Score 0 0  ?  ?Fall Risk ? ?  09/18/2021  ?  3:07 PM 08/18/2021  ?  8:20 AM  ?Fall Risk   ?Falls in the past year? 0 0  ?Number falls in past yr: 0 0  ?Injury with Fall? 0 0  ?Risk for fall due to :  No Fall Risks  ?Follow up Falls prevention discussed Falls evaluation completed  ? ? ?FALL RISK PREVENTION PERTAINING TO THE HOME: ? ?Any stairs in or around the home? Yes  ?If so, are there any without handrails? No  ?Home free of loose throw rugs in walkways, pet beds, electrical cords, etc? Yes  ?Adequate lighting in your home to reduce risk of falls? Yes  ? ?ASSISTIVE DEVICES UTILIZED TO PREVENT FALLS: ? ?Life [name]? No  ?Use of a cane, walker or w/c? No  ?Grab bars in the bathroom? Yes  ?Shower chair or bench in shower? Yes  ?Elevated toilet seat or a handicapped toilet? No  ? ?TIMED UP AND GO: ? ?Was the test performed? No .  ? ?Cognitive Function: ?  ?  ? ?  09/18/2021   ?  3:09 PM  ?6CIT Screen  ?What Year? 0 points  ?What month? 0 points  ?What time? 0 points  ?Count back from 20 0 points  ?Months in reverse 0 points  ?Repeat phrase 0 points  ?Total Score 0 points  ? ? ?

## 2021-09-18 NOTE — Patient Instructions (Signed)
Mr. Jackson [name] , ?Thank you for taking time to come for your Medicare Wellness Visit. I appreciate your ongoing commitment to your health goals. Please review the following plan we discussed and let me know if I can assist you in the future.  ? ?Screening recommendations/referrals: ?Colonoscopy: Done 01/15/20 repeat every 10 years  ?Recommended yearly ophthalmology/optometry visit for glaucoma screening and checkup ?Recommended yearly dental visit for hygiene and checkup ? ?Vaccinations: ?Influenza vaccine: done 04/26/21 ?Pneumococcal vaccine: Up to date ?Tdap vaccine: Due and discussed  ?Shingles vaccine: Completed 04/18/21 & 06/24/21   ?Covid-19: Completed 3/10, 08/14/19 & 7/31, 04/18/21 ? ?Advanced directives: Advance directive discussed with you today. Even though you declined this today please call our office should you change your mind and we can give you the proper paperwork for you to fill out. ? ?Conditions/risks identified: increase exercise  ? ?Next appointment: Follow up in one year for your annual wellness visit.  ? ?Preventive Care 37 Years and Older, Male ?Preventive care refers to lifestyle choices and visits with your health care provider that can promote health and wellness. ?What does preventive care include? ?A yearly physical exam. This is also called an annual well check. ?Dental exams once or twice a year. ?Routine eye exams. Ask your health care provider how often you should have your eyes checked. ?Personal lifestyle choices, including: ?Daily care of your teeth and gums. ?Regular physical activity. ?Eating a healthy diet. ?Avoiding tobacco and drug use. ?Limiting alcohol use. ?Practicing safe sex. ?Taking low doses of aspirin every day. ?Taking vitamin and mineral supplements as recommended by your health care provider. ?What happens during an annual well check? ?The services and screenings done by your health care provider during your annual well check will depend on your age, overall health,  lifestyle risk factors, and family history of disease. ?Counseling  ?Your health care provider may ask you questions about your: ?Alcohol use. ?Tobacco use. ?Drug use. ?Emotional well-being. ?Home and relationship well-being. ?Sexual activity. ?Eating habits. ?History of falls. ?Memory and ability to understand (cognition). ?Work and work Statistician. ?Screening  ?You may have the following tests or measurements: ?Height, weight, and BMI. ?Blood pressure. ?Lipid and cholesterol levels. These may be checked every 5 years, or more frequently if you are over 88 years old. ?Skin check. ?Lung cancer screening. You may have this screening every year starting at age 23 if you have a 30-pack-year history of smoking and currently smoke or have quit within the past 15 years. ?Fecal occult blood test (FOBT) of the stool. You may have this test every year starting at age 59. ?Flexible sigmoidoscopy or colonoscopy. You may have a sigmoidoscopy every 5 years or a colonoscopy every 10 years starting at age 19. ?Prostate cancer screening. Recommendations will vary depending on your family history and other risks. ?Hepatitis C blood test. ?Hepatitis B blood test. ?Sexually transmitted disease (STD) testing. ?Diabetes screening. This is done by checking your blood sugar (glucose) after you have not eaten for a while (fasting). You may have this done every 1-3 years. ?Abdominal aortic aneurysm (AAA) screening. You may need this if you are a current or former smoker. ?Osteoporosis. You may be screened starting at age 67 if you are at high risk. ?Talk with your health care provider about your test results, treatment options, and if necessary, the need for more tests. ?Vaccines  ?Your health care provider may recommend certain vaccines, such as: ?Influenza vaccine. This is recommended every year. ?Tetanus, diphtheria, and acellular pertussis (Tdap,  Td) vaccine. You may need a Td booster every 10 years. ?Zoster vaccine. You may need this  after age 64. ?Pneumococcal 13-valent conjugate (PCV13) vaccine. One dose is recommended after age 59. ?Pneumococcal polysaccharide (PPSV23) vaccine. One dose is recommended after age 87. ?Talk to your health care provider about which screenings and vaccines you need and how often you need them. ?This information is not intended to replace advice given to you by your health care provider. Make sure you discuss any questions you have with your health care provider. ?Document Released: 05/30/2015 Document Revised: 01/21/2016 Document Reviewed: 03/04/2015 ?Elsevier Interactive Patient Education ? 2017 [place]. ? ?Fall Prevention in the Home ?Falls can cause injuries. They can happen to people of all ages. There are many things you can do to make your home safe and to help prevent falls. ?What can I do on the outside of my home? ?Regularly fix the edges of walkways and driveways and fix any cracks. ?Remove anything that might make you trip as you walk through a door, such as a raised step or threshold. ?Trim any bushes or trees on the path to your home. ?Use bright outdoor lighting. ?Clear any walking paths of anything that might make someone trip, such as rocks or tools. ?Regularly check to see if handrails are loose or broken. Make sure that both sides of any steps have handrails. ?Any raised decks and porches should have guardrails on the edges. ?Have any leaves, snow, or ice cleared regularly. ?Use sand or salt on walking paths during winter. ?Clean up any spills in your garage right away. This includes oil or grease spills. ?What can I do in the bathroom? ?Use night lights. ?Install grab bars by the toilet and in the tub and shower. Do not use towel bars as grab bars. ?Use non-skid mats or decals in the tub or shower. ?If you need to sit down in the shower, use a plastic, non-slip stool. ?Keep the floor dry. Clean up any water that spills on the floor as soon as it happens. ?Remove soap buildup in the tub or  shower regularly. ?Attach bath mats securely with double-sided non-slip rug tape. ?Do not have throw rugs and other things on the floor that can make you trip. ?What can I do in the bedroom? ?Use night lights. ?Make sure that you have a light by your bed that is easy to reach. ?Do not use any sheets or blankets that are too big for your bed. They should not hang down onto the floor. ?Have a firm chair that has side arms. You can use this for support while you get dressed. ?Do not have throw rugs and other things on the floor that can make you trip. ?What can I do in the kitchen? ?Clean up any spills right away. ?Avoid walking on wet floors. ?Keep items that you use a lot in easy-to-reach places. ?If you need to reach something above you, use a strong step stool that has a grab bar. ?Keep electrical cords out of the way. ?Do not use floor polish or wax that makes floors slippery. If you must use wax, use non-skid floor wax. ?Do not have throw rugs and other things on the floor that can make you trip. ?What can I do with my stairs? ?Do not leave any items on the stairs. ?Make sure that there are handrails on both sides of the stairs and use them. Fix handrails that are broken or loose. Make sure that handrails are as  long as the stairways. ?Check any carpeting to make sure that it is firmly attached to the stairs. Fix any carpet that is loose or worn. ?Avoid having throw rugs at the top or bottom of the stairs. If you do have throw rugs, attach them to the floor with carpet tape. ?Make sure that you have a light switch at the top of the stairs and the bottom of the stairs. If you do not have them, ask someone to add them for you. ?What else can I do to help prevent falls? ?Wear shoes that: ?Do not have high heels. ?Have rubber bottoms. ?Are comfortable and fit you well. ?Are closed at the toe. Do not wear sandals. ?If you use a stepladder: ?Make sure that it is fully opened. Do not climb a closed stepladder. ?Make  sure that both sides of the stepladder are locked into place. ?Ask someone to hold it for you, if possible. ?Clearly mark and make sure that you can see: ?Any grab bars or handrails. ?First and last steps. ?Whe

## 2021-10-13 ENCOUNTER — Emergency Department
Admission: EM | Admit: 2021-10-13 | Discharge: 2021-10-13 | Disposition: A | Discharge status: Home / Self Care | Payer: Medicare Other | Attending: Emergency Medicine | Admitting: Emergency Medicine

## 2021-10-13 ENCOUNTER — Other Ambulatory Visit: Payer: Self-pay

## 2021-10-13 ENCOUNTER — Emergency Department: Payer: Medicare Other

## 2021-10-13 DIAGNOSIS — Z8546 Personal history of malignant neoplasm of prostate: Secondary | ICD-10-CM | POA: Insufficient documentation

## 2021-10-13 DIAGNOSIS — R112 Nausea with vomiting, unspecified: Secondary | ICD-10-CM | POA: Insufficient documentation

## 2021-10-13 DIAGNOSIS — R197 Diarrhea, unspecified: Secondary | ICD-10-CM | POA: Insufficient documentation

## 2021-10-13 DIAGNOSIS — E876 Hypokalemia: Secondary | ICD-10-CM | POA: Diagnosis not present

## 2021-10-13 DIAGNOSIS — E860 Dehydration: Secondary | ICD-10-CM | POA: Diagnosis not present

## 2021-10-13 LAB — CBC
HCT: 43.9 % (ref 39.0–52.0)
Hemoglobin: 14.5 g/dL (ref 13.0–17.0)
MCH: 27.3 pg (ref 26.0–34.0)
MCHC: 33 g/dL (ref 30.0–36.0)
MCV: 82.7 fL (ref 80.0–100.0)
Platelets: 275 10*3/uL (ref 150–400)
RBC: 5.31 MIL/uL (ref 4.22–5.81)
RDW: 12.7 % (ref 11.5–15.5)
WBC: 6.6 10*3/uL (ref 4.0–10.5)
nRBC: 0 % (ref 0.0–0.2)

## 2021-10-13 LAB — COMPREHENSIVE METABOLIC PANEL
ALT: 32 U/L (ref 0–44)
AST: 35 U/L (ref 15–41)
Albumin: 4.2 g/dL (ref 3.5–5.0)
Alkaline Phosphatase: 97 U/L (ref 38–126)
Anion gap: 13 (ref 5–15)
BUN: 33 mg/dL — ABNORMAL HIGH (ref 8–23)
CO2: 19 mmol/L — ABNORMAL LOW (ref 22–32)
Calcium: 9.3 mg/dL (ref 8.9–10.3)
Chloride: 100 mmol/L (ref 98–111)
Creatinine, Ser: 1.43 mg/dL — ABNORMAL HIGH (ref 0.61–1.24)
GFR, Estimated: 54 mL/min — ABNORMAL LOW (ref >60)
Glucose, Bld: 149 mg/dL — ABNORMAL HIGH (ref 70–99)
Potassium: 2.9 mmol/L — ABNORMAL LOW (ref 3.5–5.1)
Sodium: 132 mmol/L — ABNORMAL LOW (ref 135–145)
Total Bilirubin: 0.6 mg/dL (ref 0.3–1.2)
Total Protein: 8.4 g/dL — ABNORMAL HIGH (ref 6.5–8.1)

## 2021-10-13 LAB — LIPASE, BLOOD: Lipase: 24 U/L (ref 11–51)

## 2021-10-13 MED ORDER — IOHEXOL 300 MG/ML  SOLN
100.0000 mL | Freq: Once | INTRAMUSCULAR | Status: AC | PRN
  Administered 2021-10-13: 100 mL via INTRAVENOUS

## 2021-10-13 MED ORDER — ONDANSETRON HCL 4 MG/2ML IJ SOLN
4.0000 mg | Freq: Once | INTRAMUSCULAR | Status: AC
  Administered 2021-10-13: 4 mg via INTRAVENOUS
  Filled 2021-10-13: qty 2

## 2021-10-13 MED ORDER — POTASSIUM CHLORIDE 20 MEQ PO PACK
40.0000 meq | PACK | Freq: Two times a day (BID) | ORAL | Status: DC
  Administered 2021-10-13: 40 meq via ORAL
  Filled 2021-10-13: qty 2

## 2021-10-13 MED ORDER — SODIUM CHLORIDE 0.9 % IV BOLUS
1000.0000 mL | Freq: Once | INTRAVENOUS | Status: AC
  Administered 2021-10-13: 1000 mL via INTRAVENOUS

## 2021-10-13 MED ORDER — ONDANSETRON HCL 4 MG PO TABS: 4.0000 mg | ORAL_TABLET | Freq: Every day | ORAL | 1 refills | Status: DC | PRN

## 2021-10-13 NOTE — ED Provider Notes (Signed)
[facility] Provider Note    Event Date/Time   First MD Initiated Contact with Patient 10/13/21 [phone]     (approximate)   History   Diarrhea and Nausea   HPI  Jackson [name] is a 66 y.o. male with a past medical history of diverticular disease status post robotic sigmoidectomy in 2021, prostate cancer status post prostatectomy, osteoarthritis, who presents today for evaluation of nausea, vomiting, diarrhea.  Patient reports that he began to have diarrhea on Thursday.  His diarrhea is nonbloody.  He then reports that last night he began to have vomiting.  He has had 4 episodes of vomiting since yesterday.  He is unable to tolerate p.o.  He reports that he attempted to take Pepto-Bismol without improvement of his symptoms.  He reports that he had a fever on the first and second day of his symptoms, though no fever in the last 2 days.  No chills.  He denies any recent travel.  He denies any recent antibiotics.  He reports that he has mild discomfort across his low abdomen which he attributes to his multiple episodes of diarrhea.  Patient Active Problem List   Diagnosis Date Noted   Hyperglycemia 08/20/2021   Prediabetes 08/20/2021   Actinic keratosis 09/12/2020   Diverticular disease s/p robotic sigmoidectomy 2021 03/28/2020   Dyslipidemia 08/01/2019   Prostate cancer ([place]) s/p prostatectomy 08/01/2019   Osteoarthritis 08/01/2019          Physical Exam   Triage Vital Signs: ED Triage Vitals  Enc Vitals Group     BP 10/13/21 0828 (!) 148/83     Pulse Rate 10/13/21 0828 81     Resp 10/13/21 0828 18     Temp 10/13/21 0828 98 F (36.7 C)     Temp Source 10/13/21 0828 Oral     SpO2 10/13/21 0828 95 %     Weight 10/13/21 0829 200 lb (90.7 kg)     Height 10/13/21 0829 '5\' 10"'$  (1.778 m)     Head Circumference --      Peak Flow --      Pain Score 10/13/21 0829 4     Pain Loc --      Pain Edu? --      Excl. in [place]? --     Most recent vital signs: Vitals:    10/13/21 1050 10/13/21 1111  BP: 127/66 123/63  Pulse: 60 63  Resp: 14 16  Temp:    SpO2:  98%    Physical Exam Vitals and nursing note reviewed.  Constitutional:      General: Awake and alert. No acute distress.    Appearance: Normal appearance. He is well-developed and normal weight.  HENT:     Head: Normocephalic and atraumatic.     Mouth/Throat:     Mouth: Mucous membranes are moist.  Eyes:     General: PERRL. Normal EOMs        Right eye: No discharge.        Left eye: No discharge.     Conjunctiva/sclera: Conjunctivae normal.  Cardiovascular:     Rate and Rhythm: Normal rate and regular rhythm.     Pulses: Normal pulses.     Heart sounds: Normal heart sounds Pulmonary:     Effort: Pulmonary effort is normal. No respiratory distress.     Breath sounds: Normal breath sounds.  Abdominal:     Abdomen is soft. There is mild diffuse abdominal tenderness. No rebound or guarding. No distention.  Musculoskeletal:        General: No swelling. Normal range of motion.     Cervical back: Normal range of motion and neck supple.  Lymphadenopathy:     Cervical: No cervical adenopathy.  Skin:    General: Skin is warm and dry.     Capillary Refill: Capillary refill takes less than 2 seconds.     Findings: No rash.  Neurological:     Mental Status: He is alert.      ED Results / Procedures / Treatments   Labs (all labs ordered are listed, but only abnormal results are displayed) Labs Reviewed  COMPREHENSIVE METABOLIC PANEL - Abnormal; Notable for the following components:      Result Value   Sodium 132 (*)    Potassium 2.9 (*)    CO2 19 (*)    Glucose, Bld 149 (*)    BUN 33 (*)    Creatinine, Ser 1.43 (*)    Total Protein 8.4 (*)    GFR, Estimated 54 (*)    All other components within normal limits  STOOL CULTURE  LIPASE, BLOOD  CBC     EKG     RADIOLOGY I independently reviewed imaging and agree with radiologist findings    PROCEDURES:  Critical  Care performed:   Procedures   MEDICATIONS ORDERED IN ED: Medications  potassium chloride (KLOR-CON) packet 40 mEq (40 mEq Oral Given 10/13/21 0953)  sodium chloride 0.9 % bolus 1,000 mL (0 mLs Intravenous Stopped 10/13/21 1104)  ondansetron (ZOFRAN) injection 4 mg (4 mg Intravenous Given 10/13/21 0944)  iohexol (OMNIPAQUE) 300 MG/ML solution 100 mL (100 mLs Intravenous Contrast Given 10/13/21 0955)     IMPRESSION / MDM / ASSESSMENT AND PLAN / ED COURSE  I reviewed the triage vital signs and the nursing notes.   Differential diagnosis includes, but is not limited to, diverticulitis, gastroenteritis, bowel obstruction, dehydration, electrolyte disarray, pancreatitis, appendicitis.  Patient has had a cholecystectomy "20 years ago."  Patient presents the emergency department awake and alert, hemodynamically stable and afebrile.  Labs obtained at triage demonstrate no leukocytosis, though he does have an acute kidney injury with a creatinine elevation to 0.43 and hypokalemia to 2.9, likely in the setting of his numerous bouts of vomiting and diarrhea.  IV was established and he was given a liter of normal saline in addition to Zofran.  EKG was obtained prior to Zofran which demonstrates a normal QTc.  Patient reports feeling significantly improved after hydration and antiemetic medication.  He is able to tolerate p.o.  CT scan is suggestive of diarrheal illness without acute findings for diverticulitis, obstruction, or other intra-abdominal pathology.  He provided a stool sample.  No recent antibiotics to suggest C. difficile.  No recent travel.  No bloody diarrhea.  We discussed symptomatic management and return precautions.  Advised that he follow-up with his outpatient provider this week for recheck.     FINAL CLINICAL IMPRESSION(S) / ED DIAGNOSES   Final diagnoses:  Diarrhea, unspecified type  Nausea vomiting and diarrhea  Hypokalemia  Dehydration     Rx / DC Orders   ED Discharge  Orders          Ordered    ondansetron (ZOFRAN) 4 MG tablet  Daily PRN        10/13/21 1052             Note:  This document was prepared using Dragon voice recognition software and may include unintentional dictation errors.   Fedrick Cefalu,  Clarnce Flock, PA-C 10/13/21 1145    Harvest Dark, MD 10/13/21 [phone]

## 2021-10-13 NOTE — Discharge Instructions (Signed)
Your CAT scan shows findings consistent with diarrheal illness.  Your blood test show that you are dehydrated, and your potassium is low.  You were given potassium in the emergency department to help replete this.  You were also hydrated with IV fluids.  You may take the nausea medication as prescribed, however do not take more than prescribed as we discussed.  Please follow-up with your outpatient provider to ensure that your blood tests are normalizing as we discussed.  Please return for any new, worsening, or change in symptoms or other concerns.  It was a pleasure caring for you today.

## 2021-10-13 NOTE — ED Notes (Signed)
Pt leaving for CT. Visitor remains with pt.

## 2021-10-13 NOTE — ED Notes (Signed)
E signature pad not working. Pt educated on discharge instructions and verbalized understanding.  

## 2021-10-13 NOTE — ED Notes (Signed)
Pt finished oral meds. Pt denies nausea currently. Pt resting calmly on stretcher. Visitor remains with pt. Fluids nearly finished.

## 2021-10-13 NOTE — ED Notes (Signed)
Called lab, stool was sent from triage and they will run the stool culture. Per provider, UA not needed in order to discharge.

## 2021-10-13 NOTE — ED Triage Notes (Signed)
Pt here with diarrhea and vomiting that started Sat. Pt states he is also having lower abd pain that radiates across his entire abd. Pt states his vomit is green in color.

## 2021-10-13 NOTE — ED Notes (Signed)
See triage note. Pt in with N/V/D x4 days; pt currently laying calmly on stretcher, skin dry, resp reg/unlabored, no vomiting noted; visitor at bedside.

## 2021-10-13 NOTE — ED Notes (Signed)
Stool sample sent to lab if needed. Stool green and loose.

## 2021-10-14 ENCOUNTER — Encounter: Payer: Self-pay | Admitting: Family Medicine

## 2021-10-14 NOTE — Telephone Encounter (Signed)
Please advise 

## 2021-10-15 ENCOUNTER — Encounter: Payer: Self-pay | Admitting: Family Medicine

## 2021-10-15 NOTE — Telephone Encounter (Signed)
Can we have him schedule a follow up visit?  Algis Greenhouse. Jerline Pain, MD 10/15/2021 12:23 PM

## 2021-10-16 ENCOUNTER — Ambulatory Visit (INDEPENDENT_AMBULATORY_CARE_PROVIDER_SITE_OTHER): Payer: Medicare Other | Admitting: Family Medicine

## 2021-10-16 ENCOUNTER — Encounter: Payer: Self-pay | Admitting: Family Medicine

## 2021-10-16 VITALS — BP 98/58 | HR 70 | Temp 98.0°F | Ht 70.0 in | Wt 193.6 lb

## 2021-10-16 DIAGNOSIS — R197 Diarrhea, unspecified: Secondary | ICD-10-CM

## 2021-10-16 DIAGNOSIS — E876 Hypokalemia: Secondary | ICD-10-CM

## 2021-10-16 LAB — COMPREHENSIVE METABOLIC PANEL
ALT: 27 U/L (ref 0–53)
AST: 22 U/L (ref 0–37)
Albumin: 4.3 g/dL (ref 3.5–5.2)
Alkaline Phosphatase: 98 U/L (ref 39–117)
BUN: 29 mg/dL — ABNORMAL HIGH (ref 6–23)
CO2: 22 mEq/L (ref 19–32)
Calcium: 9.8 mg/dL (ref 8.4–10.5)
Chloride: 101 mEq/L (ref 96–112)
Creatinine, Ser: 1.2 mg/dL (ref 0.40–1.50)
GFR: 63.01 mL/min (ref >60.00)
Glucose, Bld: 130 mg/dL — ABNORMAL HIGH (ref 70–99)
Potassium: 2.9 mEq/L — ABNORMAL LOW (ref 3.5–5.1)
Sodium: 134 mEq/L — ABNORMAL LOW (ref 135–145)
Total Bilirubin: 0.5 mg/dL (ref 0.2–1.2)
Total Protein: 8.2 g/dL (ref 6.0–8.3)

## 2021-10-16 MED ORDER — ONDANSETRON 8 MG PO TBDP
8.0000 mg | ORAL_TABLET | Freq: Three times a day (TID) | ORAL | 0 refills | Status: DC | PRN
Start: 2021-10-16 — End: 2021-10-26

## 2021-10-16 NOTE — Telephone Encounter (Signed)
Patient requesting labs

## 2021-10-16 NOTE — Progress Notes (Signed)
   Jackson [name] is a 66 y.o. male who presents today for an office visit.  Assessment/Plan:  New/Acute Problems: Gastroenteritis No red flags.  Symptoms are improving.  Likely viral illness though his stool culture still pending.  We will send in more Zofran.  He can continue using Imodium as needed.  Encouraged hydration.  He should have complete resolution over the next several days.  We discussed reasons to return to care.  Follow-up as needed.  Dehydration / AKI / Hypokalemia Secondary to his diarrheal illness.  We will recheck c-Met today.  He has been taking bananas and trying to stay well-hydrated.    Subjective:  HPI:  Patient here for ED follow-up.  Went to the ED on 10/13/2021 with nausea, vomiting, and diarrhea.  In the ED was found to have acute kidney injury and low potassium.  He was given IV fluids and Zofran.  He had a CT scan which showed fluid in the colon and possible colonic inflammation.  No signs of diverticulitis or other acute abnormalities.  He tolerated a PO trial and he was discharged home.  Over the last couple of days, he was initially dealing with diarrhea every 1-2 hours for a day or so however symptoms have improved significantly over the last day.  He has not had any diarrhea today. Nausea and vomiting have been improving as well. He has been using zofran as needed. He has also been using imodium with some improvement.  No hematochezia.  No fevers or chills.  No reported dizziness, lightheadedness, chest pain, shortness of breath.       Objective:  Physical Exam: BP (!) 98/58 (BP Location: Left Arm, Patient Position: Sitting, Cuff Size: Normal)   Pulse 70   Temp 98 F (36.7 C) (Temporal)   Ht _0  (1.778 m)   Wt 193 lb 9.6 oz (87.8 kg)   SpO2 100%   BMI 27.78 kg/m   Wt Readings from Last 3 Encounters:  10/16/21 193 lb 9.6 oz (87.8 kg)  10/13/21 200 lb (90.7 kg)  08/18/21 207 lb 6.4 oz (94.1 kg)  Gen: No acute distress, resting comfortably CV:  Regular rate and rhythm with no murmurs appreciated Pulm: Normal work of breathing, clear to auscultation bilaterally with no crackles, wheezes, or rhonchi Neuro: Grossly normal, moves all extremities Psych: Normal affect and thought content  Time Spent: 45 minutes of total time was spent on the date of the encounter performing the following actions: chart review prior to seeing the patient including recent ED visit, obtaining history, performing a medically necessary exam, counseling on the treatment plan, placing orders, and documenting in our EHR.        Algis Greenhouse. Jerline Pain, MD 10/16/2021 10:46 AM

## 2021-10-16 NOTE — Patient Instructions (Signed)
It was very nice to see you today!  I am glad that you are doing better  I will send in more zofran. You can use this and the imodium.   We will check blood work today.   Take care, Dr Jerline Pain  PLEASE NOTE:  If you had any lab tests please let us know if you have not heard back within a few days. You may see your results on mychart before we have a chance to review them but we will give you a call once they are reviewed by [place]. If we ordered any referrals today, please let us know if you have not heard from their office within the next week.   Please try these tips to maintain a healthy lifestyle:  Eat at least 3 REAL meals and 1-2 snacks per day.  Aim for no more than 5 hours between eating.  If you eat breakfast, please do so within one hour of getting up.   Each meal should contain half fruits/vegetables, one quarter protein, and one quarter carbs (no bigger than a computer mouse)  Cut down on sweet beverages. This includes juice, soda, and sweet tea.   Drink at least 1 glass of water with each meal and aim for at least 8 glasses per day  Exercise at least 150 minutes every week.

## 2021-10-17 LAB — STOOL CULTURE: E coli, Shiga toxin Assay: NEGATIVE

## 2021-10-17 LAB — STOOL CULTURE REFLEX - RSASHR

## 2021-10-17 LAB — STOOL CULTURE REFLEX - CMPCXR

## 2021-10-19 ENCOUNTER — Telehealth: Payer: Self-pay | Admitting: Family Medicine

## 2021-10-19 ENCOUNTER — Encounter: Payer: Self-pay | Admitting: Family Medicine

## 2021-10-19 NOTE — Telephone Encounter (Signed)
Error

## 2021-10-19 NOTE — Telephone Encounter (Signed)
Patient is requesting call back on stool culture from hospital and metabolic panel.    Patient did complete ED follow up with with Dr. Jerline Pain on 10/16/2021.  I have advised that Dr. Jerline Pain has left early for today but that our office would reach back out to him tomorrow.

## 2021-10-19 NOTE — Telephone Encounter (Signed)
Looks like his test for salmonella was positive. This is probably what was causing his symptoms. This is a foodborne illness that the body will typically clear in a few days. We do not need to do any further testing at this point if his symptoms have resolved.  Jackson [name]. Jerline Pain, MD 10/19/2021 8:03 AM

## 2021-10-19 NOTE — Telephone Encounter (Signed)
Pt is requesting a call back since results are in and has questions about prescriptions.

## 2021-10-19 NOTE — Telephone Encounter (Signed)
Pt has called in regard to lab results. He is requesting a callback as soon as possible to [phone].

## 2021-10-20 ENCOUNTER — Telehealth: Payer: Self-pay | Admitting: *Deleted

## 2021-10-20 ENCOUNTER — Other Ambulatory Visit: Payer: Self-pay | Admitting: Family Medicine

## 2021-10-20 ENCOUNTER — Other Ambulatory Visit: Payer: Self-pay | Admitting: *Deleted

## 2021-10-20 DIAGNOSIS — E876 Hypokalemia: Secondary | ICD-10-CM

## 2021-10-20 MED ORDER — CIPROFLOXACIN HCL 500 MG PO TABS: 500.0000 mg | ORAL_TABLET | Freq: Two times a day (BID) | ORAL | 0 refills | Status: DC

## 2021-10-20 NOTE — Telephone Encounter (Signed)
Patient has called in stating he is now vomiting along with diarrhea.  I am sending to triage.

## 2021-10-20 NOTE — Telephone Encounter (Signed)
Spoke with patient, patient pick up Rx and started today

## 2021-10-20 NOTE — Telephone Encounter (Signed)
Results given.

## 2021-10-20 NOTE — Telephone Encounter (Signed)
Patient Name: Jackson [name] NO Gender: Male DOB: 1955/06/12 Age: 66 Y 72 M 16 D Return Phone Number: [phone] (Primary), [phone] (Secondary) Address: City/ State/ Zip: [place]  [address] Client [facility] [place] [facility] [place] [facility] Provider Dimas Chyle- MD Contact Type Call Who Is Calling Patient / Member / Family / Caregiver Call Type Triage / Clinical Relationship To Patient Self Return Phone Number [phone] (Primary) Chief Complaint Vomiting Reason for Call Symptomatic / Request for Health Information Initial Comment Office transferring patient caller states patient was seen in ER last week with diarrhea and nausea with vomiting, Stool culture was done, patient thinks he may have salmonella, he was in the office on Friday for ED follow up but test result were not ready and caller still has vomiting and diarrhea Translation No Nurse Assessment Nurse: [name], RN, Marcie Bal Date/Time (Eastern Time): 10/20/2021 12:00:57 PM Confirm and document reason for call. If symptomatic, describe symptoms. ---Office transferring patient caller states patient was seen in ER last week with diarrhea and nausea with vomiting, Stool culture was done, patient thinks he may have salmonella, he was in the office on Friday for ED follow up but test result were not ready and caller still has vomiting and diarrhea. Now, vomiting too. Day 11. States he not heard from Dr or office after results. Does the patient have any new or worsening symptoms? ---Yes Will a triage be completed? ---Yes Related visit to physician within the last 2 weeks? ---Yes Does the PT have any chronic conditions? (i.e. diabetes, asthma, this includes High risk factors for pregnancy, etc.) ---No Is this a behavioral health or substance abuse call? ---No  Guidelines Guideline Title Affirmed Question Affirmed Notes Nurse Date/Time  Eilene Ghazi Time) Vomiting [1] MODERATE vomiting (e.g., 3 - 5 times/day) AND [2] age > 54 years Etter Sjogren 10/20/2021 12:04:07 PM Disp. Time Eilene Ghazi Time) Disposition Final User 10/20/2021 12:05:52 PM Go to ED Now (or PCP triage) Yes [name], RN, Lenon Oms Disagree/Comply Comply Caller Understands Yes PreDisposition Call Doctor Care Advice Given Per Guideline GO TO ED NOW (OR PCP TRIAGE): * IF NO PCP (PRIMARY CARE PROVIDER) SECOND-LEVEL TRIAGE: You need to be seen within the next hour. Go to the [facility] at _____________ [place] as soon as you can. CARE ADVICE per Vomiting (Adult) guideline. BRING A BUCKET IN CASE OF VOMITING: * You may wish to bring a bucket, pan, or plastic bag with you in case there is [name] vomiting during the drive. Comments User: Manning Charity, RN Date/Time Eilene Ghazi Time): 10/20/2021 12:09:12 PM warm tsfrd to [place]. User: Manning Charity, RN Date/Time Eilene Ghazi Time): 10/20/2021 12:10:05 PM Pt irritated that he has not gotten results and how to proceed from here. Referrals REFERRED TO PCP OFFICE

## 2021-10-20 NOTE — Progress Notes (Signed)
Please inform patient of the following:  His potassium is still low but he is a little bit less dehydrated than last time.  We should recheck again in 1 to 2 weeks.  Please place future order for c-Met.

## 2021-10-20 NOTE — Telephone Encounter (Signed)
Spoke with patient stated still with symptoms, vomiting. Ok to send antibiotic to [facility]

## 2021-10-20 NOTE — Telephone Encounter (Signed)
Patient requesting stool culture results

## 2021-10-20 NOTE — Telephone Encounter (Signed)
If he is still having symptoms recommend starting antibiotics.  I will send in prescription for ciprofloxacin.  He should continue taking Zofran as needed for the nausea and vomiting.  He should continue to make sure he is getting plenty of fluids as well.  Would like for him to let us know if his symptoms are not improving over the next several days.

## 2021-10-26 ENCOUNTER — Encounter: Payer: Self-pay | Admitting: Family Medicine

## 2021-10-26 ENCOUNTER — Ambulatory Visit (INDEPENDENT_AMBULATORY_CARE_PROVIDER_SITE_OTHER): Payer: Medicare Other | Admitting: Family Medicine

## 2021-10-26 VITALS — BP 120/66 | HR 68 | Temp 99.2°F | Ht 70.0 in | Wt 185.1 lb

## 2021-10-26 DIAGNOSIS — R197 Diarrhea, unspecified: Secondary | ICD-10-CM | POA: Diagnosis not present

## 2021-10-26 DIAGNOSIS — E876 Hypokalemia: Secondary | ICD-10-CM

## 2021-10-26 MED ORDER — ONDANSETRON 8 MG PO TBDP: 8.0000 mg | ORAL_TABLET | Freq: Three times a day (TID) | ORAL | 0 refills | Status: DC | PRN

## 2021-10-26 NOTE — Progress Notes (Addendum)
Subjective:     Patient ID: Jackson [name], male    DOB: 03-18-1956, 66 y.o.   MRN: [id number]  Chief Complaint  Patient presents with   Diarrhea    Loose stools not getting any better, has been 17 days, takes last cipro today    HPI Diarrhea for 17 days.  Has had CT, labs.  Was + for Salmonella-cipro-last dose today.  Still loose stools-but more lumps than water now!  Has gone 3x today.  Was 7-8 times. Went to ER after day 4-got IVF and K.   Did had fever for 2 days and intermitt for few days.  Hadn't been traveling.  Not eating out.  No one else sick.  Well water.  Uses filters.    Feels weak.  Nauseated when stands up and moving. No dizziness.     Where IV was, still bruise.    There are no preventive care reminders to display for this patient.  Past Medical History:  Diagnosis Date   Anginal pain ([place]) 2019   heat stress. stress test was negative   Arthritis    Diverticulitis    Diverticulosis    HLD (hyperlipidemia)    Prostate cancer [facility])     Past Surgical History:  Procedure Laterality Date   CHOLECYSTECTOMY  05/18/2007   CYSTOSCOPY WITH STENT PLACEMENT N/A 03/28/2020   Procedure: FIREFLY INJECTION;  Surgeon: Ardis Hughs, MD;  Location: [facility];  Service: Urology;  Laterality: N/A;   EYE SURGERY     PROSTATECTOMY  2011   REFRACTIVE SURGERY Bilateral    eyes   TONSILLECTOMY      Outpatient Medications Prior to Visit  Medication Sig Dispense Refill   acetaminophen (TYLENOL) 500 MG tablet Take 1,000 mg by mouth every 6 (six) hours as needed.     atorvastatin (LIPITOR) 40 MG tablet TAKE 1 TABLET BY MOUTH EVERY DAY 90 tablet 0   ciprofloxacin (CIPRO) 500 MG tablet Take 1 tablet (500 mg total) by mouth 2 (two) times daily for 7 days. 14 tablet 0   Green Tea, Camellia sinensis, (GREEN TEA PO) Take 315 mg by mouth daily as needed (energy).     Multiple Vitamins-Minerals (ADULT GUMMY PO) Take 2 tablets by mouth daily.     vardenafil (LEVITRA) 20 MG tablet Take  0.5-1 tablets (10-20 mg total) by mouth daily as needed for erectile dysfunction. (Patient taking differently: Take 10 mg by mouth daily as needed for erectile dysfunction.) 10 tablet 0   ondansetron (ZOFRAN-ODT) 8 MG disintegrating tablet Take 1 tablet (8 mg total) by mouth every 8 (eight) hours as needed for nausea or vomiting. 20 tablet 0   FIBER SELECT GUMMIES PO Take 2 tablets by mouth daily. (Patient not taking: Reported on 10/26/2021)     No facility-administered medications prior to visit.    No Known Allergies ROS neg/noncontributory except as noted HPI/below Blood when blew nose last pm.       Objective:     BP 120/66   Pulse 68   Temp 99.2 F (37.3 C) (Temporal)   Ht '5\' 10"'$  (1.778 m)   Wt 185 lb 2 oz (84 kg)   SpO2 96%   BMI 26.56 kg/m  Wt Readings from Last 3 Encounters:  10/26/21 185 lb 2 oz (84 kg)  10/16/21 193 lb 9.6 oz (87.8 kg)  10/13/21 200 lb (90.7 kg)    Physical Exam   Gen: WDWN NAD but tired appearing HEENT: NCAT, conjunctiva not injected, sclera  nonicteric NECK:  supple, no thyromegaly, no nodes, no carotid bruits CARDIAC: RRR, S1S2+, no murmur. DP 2+B LUNGS: CTAB. No wheezes ABDOMEN:  BS+, soft, NTND, No HSM, no masses EXT:  no edema MSK: no gross abnormalities.  NEURO: A&O x3.  CN II-XII intact.  PSYCH: normal mood. Good eye contact  Reviewed chart.   Pt has lost 15# since 5/30.     Assessment & Plan:   Problem List Items Addressed This Visit   None Visit Diagnoses     Diarrhea, unspecified type    -  Primary   Relevant Orders   Comprehensive metabolic panel   TSH   CBC with Differential/Platelet   C. difficile GDH and Toxin A/B   Gastrointestinal Pathogen Pnl RT, PCR      Diarrhea-did have Salmonella-which was tx'd.  Still having some diarrhea and feeling weak.  ? If more going on.  Will check cbc,cmp,tsh, c diff and other GI pathogens.  Zofran '8mg'$  tid prn nausea.  Drink fluids.  Avoid dairy.  Get electrolytes.  Will message GI  as well.   Meds ordered this encounter  Medications   ondansetron (ZOFRAN-ODT) 8 MG disintegrating tablet    Sig: Take 1 tablet (8 mg total) by mouth every 8 (eight) hours as needed for nausea or vomiting.    Dispense:  20 tablet    Refill:  0    Wellington Hampshire, MD  Addendum-discussed labs w/pt.  K still very low and Cr>2.  Needs to go to ER-IVF and K replacement.  Will send 84mq K daily to pharm-when diarrhea stops(none thus far today), can stop K.  Repeat bmp on Friday.  Pt voiced understanding).  Hopefully, the diarrhea is resolving.  Ak 10/27/21 1250

## 2021-10-26 NOTE — Telephone Encounter (Signed)
Pt states he also had a nose bleed before taking his last antibiotic pill at 1:00am today (10/26/21).  Pt states he would like to know what to do since this is so persistent.   Pt scheduled an appointment with Dr. Cherlynn Kaiser at 3:00pm today. - because PCP is on leave, routing message to both Care Teams.

## 2021-10-26 NOTE — Patient Instructions (Addendum)
It was very nice to see you today!  Small amounts of fluids frequently.  Electrolytes I will message GI as well   PLEASE NOTE:  If you had any lab tests please let us know if you have not heard back within a few days. You may see your results on MyChart before we have a chance to review them but we will give you a call once they are reviewed by [place]. If we ordered any referrals today, please let us know if you have not heard from their office within the next week.   Please try these tips to maintain a healthy lifestyle:  Eat most of your calories during the day when you are active. Eliminate processed foods including packaged sweets (pies, cakes, cookies), reduce intake of potatoes, white bread, white pasta, and white rice. Look for whole grain options, oat flour or almond flour.  Each meal should contain half fruits/vegetables, one quarter protein, and one quarter carbs (no bigger than a computer mouse).  Cut down on sweet beverages. This includes juice, soda, and sweet tea. Also watch fruit intake, though this is a healthier sweet option, it still contains natural sugar! Limit to 3 servings daily.  Drink at least 1 glass of water with each meal and aim for at least 8 glasses per day  Exercise at least 150 minutes every week.

## 2021-10-27 ENCOUNTER — Telehealth: Payer: Self-pay

## 2021-10-27 ENCOUNTER — Other Ambulatory Visit: Payer: Self-pay

## 2021-10-27 ENCOUNTER — Inpatient Hospital Stay
Admission: EM | Admit: 2021-10-27 | Discharge: 2021-10-29 | DRG: 683 | Disposition: A | Discharge status: Home / Self Care | Payer: Medicare Other | Source: Ambulatory Visit | Attending: Internal Medicine | Admitting: Internal Medicine

## 2021-10-27 DIAGNOSIS — E860 Dehydration: Secondary | ICD-10-CM | POA: Diagnosis present

## 2021-10-27 DIAGNOSIS — Z8546 Personal history of malignant neoplasm of prostate: Secondary | ICD-10-CM

## 2021-10-27 DIAGNOSIS — N179 Acute kidney failure, unspecified: Secondary | ICD-10-CM | POA: Diagnosis not present

## 2021-10-27 DIAGNOSIS — E785 Hyperlipidemia, unspecified: Secondary | ICD-10-CM | POA: Diagnosis present

## 2021-10-27 DIAGNOSIS — Z833 Family history of diabetes mellitus: Secondary | ICD-10-CM

## 2021-10-27 DIAGNOSIS — E663 Overweight: Secondary | ICD-10-CM | POA: Diagnosis present

## 2021-10-27 DIAGNOSIS — Z6826 Body mass index (BMI) 26.0-26.9, adult: Secondary | ICD-10-CM

## 2021-10-27 DIAGNOSIS — E871 Hypo-osmolality and hyponatremia: Secondary | ICD-10-CM | POA: Diagnosis present

## 2021-10-27 DIAGNOSIS — Z801 Family history of malignant neoplasm of trachea, bronchus and lung: Secondary | ICD-10-CM

## 2021-10-27 DIAGNOSIS — A020 Salmonella enteritis: Secondary | ICD-10-CM | POA: Diagnosis present

## 2021-10-27 DIAGNOSIS — Z79899 Other long term (current) drug therapy: Secondary | ICD-10-CM

## 2021-10-27 DIAGNOSIS — E876 Hypokalemia: Principal | ICD-10-CM | POA: Diagnosis present

## 2021-10-27 LAB — CBC WITH DIFFERENTIAL/PLATELET
Abs Immature Granulocytes: 0.07 10*3/uL (ref 0.00–0.07)
Basophils Absolute: 0.1 10*3/uL (ref 0.0–0.1)
Basophils Absolute: 0.1 10*3/uL (ref 0.0–0.1)
Basophils Relative: 0.9 % (ref 0.0–3.0)
Basophils Relative: 1 %
Eosinophils Absolute: 0.1 10*3/uL (ref 0.0–0.7)
Eosinophils Absolute: 0.1 10*3/uL (ref 0.0–0.5)
Eosinophils Relative: 0.8 % (ref 0.0–5.0)
Eosinophils Relative: 1 %
HCT: 37.8 % — ABNORMAL LOW (ref 39.0–52.0)
HCT: 38.1 % — ABNORMAL LOW (ref 39.0–52.0)
Hemoglobin: 12.9 g/dL — ABNORMAL LOW (ref 13.0–17.0)
Hemoglobin: 12.8 g/dL — ABNORMAL LOW (ref 13.0–17.0)
Immature Granulocytes: 1 %
Lymphocytes Relative: 14.6 % (ref 12.0–46.0)
Lymphocytes Relative: 21 %
Lymphs Abs: 1.5 10*3/uL (ref 0.7–4.0)
Lymphs Abs: 2 10*3/uL (ref 0.7–4.0)
MCH: 27 pg (ref 26.0–34.0)
MCHC: 34.1 g/dL (ref 30.0–36.0)
MCHC: 33.6 g/dL (ref 30.0–36.0)
MCV: 82 fl (ref 78.0–100.0)
MCV: 80.4 fL (ref 80.0–100.0)
Monocytes Absolute: 1.1 10*3/uL — ABNORMAL HIGH (ref 0.1–1.0)
Monocytes Absolute: 0.9 10*3/uL (ref 0.1–1.0)
Monocytes Relative: 10.2 % (ref 3.0–12.0)
Monocytes Relative: 10 %
Neutro Abs: 7.7 10*3/uL (ref 1.4–7.7)
Neutro Abs: 6.6 10*3/uL (ref 1.7–7.7)
Neutrophils Relative %: 73.5 % (ref 43.0–77.0)
Neutrophils Relative %: 66 %
Platelets: 462 10*3/uL — ABNORMAL HIGH (ref 150.0–400.0)
Platelets: 424 10*3/uL — ABNORMAL HIGH (ref 150–400)
RBC: 4.62 Mil/uL (ref 4.22–5.81)
RBC: 4.74 MIL/uL (ref 4.22–5.81)
RDW: 12.9 % (ref 11.5–15.5)
RDW: 12.6 % (ref 11.5–15.5)
WBC: 10.4 10*3/uL (ref 4.0–10.5)
WBC: 9.8 10*3/uL (ref 4.0–10.5)
nRBC: 0 % (ref 0.0–0.2)

## 2021-10-27 LAB — COMPREHENSIVE METABOLIC PANEL
ALT: 44 U/L (ref 0–53)
AST: 32 U/L (ref 0–37)
Albumin: 3.8 g/dL (ref 3.5–5.2)
Alkaline Phosphatase: 105 U/L (ref 39–117)
BUN: 39 mg/dL — ABNORMAL HIGH (ref 6–23)
CO2: 23 mEq/L (ref 19–32)
Calcium: 9.2 mg/dL (ref 8.4–10.5)
Chloride: 97 mEq/L (ref 96–112)
Creatinine, Ser: 2.14 mg/dL — ABNORMAL HIGH (ref 0.40–1.50)
GFR: 31.47 mL/min — ABNORMAL LOW (ref >60.00)
Glucose, Bld: 116 mg/dL — ABNORMAL HIGH (ref 70–99)
Potassium: 2.8 mEq/L — CL (ref 3.5–5.1)
Sodium: 132 mEq/L — ABNORMAL LOW (ref 135–145)
Total Bilirubin: 0.5 mg/dL (ref 0.2–1.2)
Total Protein: 7.7 g/dL (ref 6.0–8.3)

## 2021-10-27 LAB — MAGNESIUM: Magnesium: 2.4 mg/dL (ref 1.7–2.4)

## 2021-10-27 LAB — BASIC METABOLIC PANEL
Anion gap: 11 (ref 5–15)
BUN: 41 mg/dL — ABNORMAL HIGH (ref 8–23)
CO2: 24 mmol/L (ref 22–32)
Calcium: 8.6 mg/dL — ABNORMAL LOW (ref 8.9–10.3)
Chloride: 97 mmol/L — ABNORMAL LOW (ref 98–111)
Creatinine, Ser: 2.3 mg/dL — ABNORMAL HIGH (ref 0.61–1.24)
GFR, Estimated: 31 mL/min — ABNORMAL LOW (ref >60)
Glucose, Bld: 149 mg/dL — ABNORMAL HIGH (ref 70–99)
Potassium: 2.5 mmol/L — CL (ref 3.5–5.1)
Sodium: 132 mmol/L — ABNORMAL LOW (ref 135–145)

## 2021-10-27 LAB — TSH: TSH: 0.78 u[IU]/mL (ref 0.35–5.50)

## 2021-10-27 MED ORDER — ONDANSETRON HCL 4 MG PO TABS: 4.0000 mg | ORAL_TABLET | Freq: Four times a day (QID) | ORAL | Status: DC | PRN

## 2021-10-27 MED ORDER — POTASSIUM CHLORIDE CRYS ER 20 MEQ PO TBCR
40.0000 meq | EXTENDED_RELEASE_TABLET | ORAL | Status: AC
  Administered 2021-10-27 (×2): 40 meq via ORAL
  Filled 2021-10-27 (×2): qty 2

## 2021-10-27 MED ORDER — ACETAMINOPHEN 500 MG PO TABS: 1000.0000 mg | ORAL_TABLET | Freq: Four times a day (QID) | ORAL | Status: DC | PRN

## 2021-10-27 MED ORDER — SODIUM CHLORIDE 0.9 % IV BOLUS
1000.0000 mL | Freq: Once | INTRAVENOUS | Status: AC
  Administered 2021-10-27: 1000 mL via INTRAVENOUS

## 2021-10-27 MED ORDER — CIPROFLOXACIN HCL 500 MG PO TABS
500.0000 mg | ORAL_TABLET | Freq: Two times a day (BID) | ORAL | Status: DC
  Filled 2021-10-27: qty 1

## 2021-10-27 MED ORDER — ENOXAPARIN SODIUM 40 MG/0.4ML IJ SOSY
40.0000 mg | PREFILLED_SYRINGE | INTRAMUSCULAR | Status: DC
  Administered 2021-10-27 – 2021-10-28 (×2): 40 mg via SUBCUTANEOUS
  Filled 2021-10-27 (×2): qty 0.4

## 2021-10-27 MED ORDER — POTASSIUM CHLORIDE CRYS ER 20 MEQ PO TBCR: 40.0000 meq | EXTENDED_RELEASE_TABLET | Freq: Every day | ORAL | 1 refills | Status: DC

## 2021-10-27 MED ORDER — ONDANSETRON HCL 4 MG/2ML IJ SOLN: 4.0000 mg | Freq: Four times a day (QID) | INTRAMUSCULAR | Status: DC | PRN

## 2021-10-27 MED ORDER — LACTATED RINGERS IV SOLN: INTRAVENOUS | Status: DC

## 2021-10-27 MED ORDER — POTASSIUM CHLORIDE CRYS ER 20 MEQ PO TBCR
40.0000 meq | EXTENDED_RELEASE_TABLET | Freq: Once | ORAL | Status: AC
  Administered 2021-10-27: 40 meq via ORAL
  Filled 2021-10-27: qty 2

## 2021-10-27 MED ORDER — ATORVASTATIN CALCIUM 20 MG PO TABS
40.0000 mg | ORAL_TABLET | Freq: Every day | ORAL | Status: DC
  Administered 2021-10-28: 40 mg via ORAL
  Filled 2021-10-27 (×2): qty 2

## 2021-10-27 NOTE — ED Notes (Signed)
Received report from [employer].

## 2021-10-27 NOTE — ED Triage Notes (Signed)
Pt sent jere by his primary for fluids. Pt had labs drawn yesterday showing some abnormal values. Pt states he had some nausea and vomiting but it has now passed, Pt denies pain.

## 2021-10-27 NOTE — ED Notes (Signed)
Pt states his normal HR is usually in the 50s.

## 2021-10-27 NOTE — Assessment & Plan Note (Addendum)
Patient diagnosed with Salmonella enteritis about 2 weeks ago and has completed his course of ciprofloxacin.  Stools are starting to bulk up

## 2021-10-27 NOTE — Assessment & Plan Note (Addendum)
Results of severe hypokalemia from GI losses.  Replaced as needed.  Potassium on 6/15 at 3.9.  Magnesium normal.

## 2021-10-27 NOTE — Addendum Note (Signed)
Addended by: Wellington Hampshire on: 10/27/2021 12:49 PM   Modules accepted: Orders

## 2021-10-27 NOTE — Progress Notes (Signed)
Jackson [name], I am off work today but patient's PCP forward office note today regarding ongoing diarrhea. Pls contact patient for a symptoms update and schedule him for a follow up appointment asap. Thx

## 2021-10-27 NOTE — ED Provider Notes (Signed)
[facility] Provider Note    Event Date/Time   First MD Initiated Contact with Patient 10/27/21 1313     (approximate)   History   Dehydration   HPI  Jackson [name] is a 66 y.o. male  who, per primary care note has been having diarrhea and had low potassium and elevated creatinine on blood work drawn yesterday, presents to the emergency department today because of concern for dehydration.  The patient states that he has had diarrhea for roughly 18 days.  He was diagnosed with Salmonella.  He is finishing up the antibiotics.  Today is the first day he has not had any diarrhea.  Went to primary care office yesterday and blood work was drawn which was concerning for elevation of the creatinine and low potassium.  Physical Exam   Triage Vital Signs: ED Triage Vitals  Enc Vitals Group     BP 10/27/21 1315 125/78     Pulse Rate 10/27/21 1315 (!) 58     Resp 10/27/21 1315 16     Temp 10/27/21 1315 98.1 F (36.7 C)     Temp Source 10/27/21 1315 Oral     SpO2 10/27/21 1315 99 %     Weight 10/27/21 1316 185 lb 3 oz (84 kg)     Height 10/27/21 1316 '5\' 10"'$  (1.778 m)     Head Circumference --      Peak Flow --      Pain Score 10/27/21 1316 0     Pain Loc --      Pain Edu? --      Excl. in [place]? --     Most recent vital signs: Vitals:   10/27/21 1315  BP: 125/78  Pulse: (!) 58  Resp: 16  Temp: 98.1 F (36.7 C)  SpO2: 99%    General: Awake, alert. CV:  Good peripheral perfusion. Regular rate and rhythm. Resp:  Normal effort. Lungs clear. Abd:  No distention.   ED Results / Procedures / Treatments   Labs (all labs ordered are listed, but only abnormal results are displayed) Labs Reviewed  CBC WITH DIFFERENTIAL/PLATELET - Abnormal; Notable for the following components:      Result Value   Hemoglobin 12.8 (*)    HCT 38.1 (*)    Platelets 424 (*)    All other components within normal limits  BASIC METABOLIC PANEL     EKG  I, Nance Pear,  attending physician, personally viewed and interpreted this EKG  EKG Time: 1404 Rate: 53 Rhythm: sinus rhythm with ventricular bradycardia Axis: normal Intervals: qtc 429 QRS: narrow, q waves V1 ST changes: no st elevation Impression: abnormal ekg  RADIOLOGY None  PROCEDURES:  Critical Care performed: No  Procedures   MEDICATIONS ORDERED IN ED: Medications  sodium chloride 0.9 % bolus 1,000 mL (1,000 mLs Intravenous New Bag/Given 10/27/21 1331)     IMPRESSION / MDM / ASSESSMENT AND PLAN / ED COURSE  I reviewed the triage vital signs and the nursing notes.                              Differential diagnosis includes, but is not limited to, hypokalemia, dehydration, AKI.  Patient's presentation is most consistent with acute presentation with potential threat to life or bodily function.  Patient presented to the emergency department today because of concerns for hypokalemia and AKI found by PCP yesterday.  Blood work today does confirm this  and shows slightly worsening AKI and hypokalemia.  Patient was given IV fluids and potassium here in the emergency department.  Discussed with Dr. Francine Graven with the hospitalist service who will plan on admission.  FINAL CLINICAL IMPRESSION(S) / ED DIAGNOSES   Final diagnoses:  Hypokalemia  AKI (acute kidney injury) ([place])     Note:  This document was prepared using Dragon voice recognition software and may include unintentional dictation errors.    Nance Pear, MD 10/27/21 1452

## 2021-10-27 NOTE — Telephone Encounter (Signed)
Left message for pt to call back  °

## 2021-10-27 NOTE — Assessment & Plan Note (Addendum)
Prerenal and secondary to volume depletion from GI losses.  Patient's baseline creatinine is normal.  Creatinine of 1.2 seen several weeks ago above baseline, secondary to his enteritis.  By 6/15, creatinine down to 1.18 with a BUN of 31.  Patient received 6 more hours of IV fluids and felt to be stable for discharge

## 2021-10-27 NOTE — Telephone Encounter (Signed)
CRITICAL LAB:  Potassium @ 2.8

## 2021-10-27 NOTE — Telephone Encounter (Signed)
-----   Message from Noralyn Pick, NP sent at 10/27/2021  2:55 PM EDT -----    ----- Message ----- From: Tawnya Crook, MD Sent: 10/26/2021   9:06 PM EDT To: Noralyn Pick, NP

## 2021-10-27 NOTE — ED Notes (Signed)
Pt is alert and oriented, speaking in full sentences, breathing easy and unlabored, request status of bed assignment, advised pt that I would check and let him know, bed has been assigned, pt notified.

## 2021-10-27 NOTE — Assessment & Plan Note (Addendum)
Noted to have hyponatremia secondary to volume depletion from GI source.  Resolved on hospital day 2.  Sodium 1 6/15 at 144

## 2021-10-27 NOTE — H&P (Addendum)
History and Physical    Patient: Jackson [name] OZH:[id number] DOB: [birth year] DOA: 10/27/2021 DOS: the patient was seen and examined on 10/27/2021 PCP: Vivi Barrack, MD  Patient coming from: Home  Chief Complaint:  Chief Complaint  Patient presents with   Dehydration   HPI: Jackson [name] is a 66 y.o. male with medical history significant for prostate cancer, diverticulosis, dyslipidemia who presents to the emergency room at the request of his primary care provider for evaluation of abnormal labs. Patient states that he was diagnosed with Salmonella enteritis over 2 weeks ago and has had daily diarrhea for approximately 17 days.  He describes 7-10 loose watery stools daily.  He did have associated nausea and emesis which has resolved and has not had a bowel movement on the day of admission.  He denies having any fever or chills. He had lab work done by his primary care provider and was called because his potassium was low at 2.5 and his serum creatinine was elevated.  He was advised to go to the hospital for further evaluation. He complains of blurred vision but denies having any chest pain, no shortness of breath, no dizziness, no lightheadedness, no urinary frequency, no dysuria, no hematuria, no fever, no chills, no leg swelling, no headache or focal deficit.    Review of Systems: As mentioned in the history of present illness. All other systems reviewed and are negative. Past Medical History:  Diagnosis Date   Anginal pain ([place]) 2019   heat stress. stress test was negative   Arthritis    Diverticulitis    Diverticulosis    HLD (hyperlipidemia)    Prostate cancer [facility])    Past Surgical History:  Procedure Laterality Date   CHOLECYSTECTOMY  05/18/2007   CYSTOSCOPY WITH STENT PLACEMENT N/A 03/28/2020   Procedure: FIREFLY INJECTION;  Surgeon: Ardis Hughs, MD;  Location: [facility] ORS;  Service: Urology;  Laterality: N/A;   EYE SURGERY     PROSTATECTOMY  2011   REFRACTIVE SURGERY  Bilateral    eyes   TONSILLECTOMY     Social History:  reports that he has never smoked. He has never used smokeless tobacco. He reports current alcohol use. He reports that he does not use drugs.  No Known Allergies  Family History  Problem Relation Age of Onset   Lung cancer Mother    Depression Mother    Diabetes Brother    Alcohol abuse Brother    Depression Brother    Early death Father    Arthritis Father    Alcohol abuse Son    Cancer Maternal Grandfather        type unknown   Colon cancer Neg Hx    Esophageal cancer Neg Hx    Rectal cancer Neg Hx    Stomach cancer Neg Hx     Prior to Admission medications   Medication Sig Start Date End Date Taking? Authorizing Provider  acetaminophen (TYLENOL) 500 MG tablet Take 1,000 mg by mouth every 6 (six) hours as needed.   Yes [provider]  atorvastatin (LIPITOR) 40 MG tablet TAKE 1 TABLET BY MOUTH EVERY DAY 08/03/21  Yes Vivi Barrack, MD  ciprofloxacin (CIPRO) 500 MG tablet Take 1 tablet (500 mg total) by mouth 2 (two) times daily for 7 days. 10/20/21 10/27/21 Yes Vivi Barrack, MD  Multiple Vitamins-Minerals (ADULT GUMMY PO) Take 2 tablets by mouth daily.   Yes [provider]  ondansetron (ZOFRAN-ODT) 8 MG disintegrating tablet Take 1 tablet (  8 mg total) by mouth every 8 (eight) hours as needed for nausea or vomiting. 10/26/21  Yes Tawnya Crook, MD  potassium chloride SA (KLOR-CON M) 20 MEQ tablet Take 2 tablets (40 mEq total) by mouth daily. 10/27/21  Yes Tawnya Crook, MD  FIBER SELECT GUMMIES PO Take 2 tablets by mouth daily. Patient not taking: Reported on 10/26/2021    [provider]  Nyoka Cowden Tea, Camellia sinensis, (GREEN TEA PO) Take 315 mg by mouth daily as needed (energy).    [provider]  vardenafil (LEVITRA) 20 MG tablet Take 0.5-1 tablets (10-20 mg total) by mouth daily as needed for erectile dysfunction. Patient taking differently: Take 10 mg by mouth daily as needed  for erectile dysfunction. 01/22/20   Vivi Barrack, MD    Physical Exam: Vitals:   10/27/21 1315 10/27/21 1316 10/27/21 1400  BP: 125/78  121/70  Pulse: (!) 58  (!) 50  Resp: 16    Temp: 98.1 F (36.7 C)    TempSrc: Oral    SpO2: 99%  99%  Weight:  84 kg   Height:  '5\' 10"'$  (1.778 m)    Physical Exam Vitals and nursing note reviewed.  Constitutional:      Appearance: Normal appearance.  HENT:     Head: Normocephalic and atraumatic.     Nose: Nose normal.     Mouth/Throat:     Mouth: Mucous membranes are dry.  Eyes:     Pupils: Pupils are equal, round, and reactive to light.  Cardiovascular:     Rate and Rhythm: Bradycardia present.  Pulmonary:     Effort: Pulmonary effort is normal.     Breath sounds: Normal breath sounds.  Abdominal:     General: Abdomen is flat. Bowel sounds are normal.     Palpations: Abdomen is soft.  Musculoskeletal:        General: Normal range of motion.     Cervical back: Normal range of motion and neck supple.  Skin:    General: Skin is warm and dry.  Neurological:     General: No focal deficit present.     Mental Status: He is alert.  Psychiatric:        Mood and Affect: Mood normal.        Behavior: Behavior normal.     Data Reviewed: Relevant notes from primary care and specialist visits, past discharge summaries as available in EHR, including Care Everywhere. Prior diagnostic testing as pertinent to current admission diagnoses Updated medications and problem lists for reconciliation ED course, including vitals, labs, imaging, treatment and response to treatment Triage notes, nursing and pharmacy notes and ED provider's notes Notable results as noted in HPI Labs reviewed.  Magnesium 2.4, sodium 132, potassium 2.5, chloride 97, bicarb 24, glucose 149, BUN 41, creatinine 2.30, calcium 8.6, white count 9.8, hemoglobin 12.8, hematocrit 38.1, platelet count 424, TSH 0.78 Twelve-lead EKG reviewed by me shows sinus rhythm with ventricular  bigeminy There are no new results to review at this time.  Assessment and Plan: * AKI (acute kidney injury) ([place]) Prerenal and secondary to volume depletion from GI losses Patient has a baseline serum of 1.20 and today on admission it is 2.30 with elevated BUN levels Continue aggressive IV fluid resuscitation Repeat renal parameters in a.m. Avoid nephrotoxic agents   Hypokalemia Results of severe hypokalemia from GI losses Supplement potassium Magnesium levels are within normal limits  Hyponatremia with extracellular fluid depletion Noted to have hyponatremia secondary to  volume depletion from GI source Continue IV fluid resuscitation Repeat sodium levels in a.m.  Salmonella enteritis Patient diagnosed with Salmonella enteritis about 2 weeks ago and has completed his course of ciprofloxacin      Advance Care Planning:   Code Status: Full Code   Consults: None  Family Communication: Greater than 50% of time was spent discussing patient's condition and plan of care with him at the bedside.  All questions and concerns have been addressed.  He verbalizes understanding and agrees with the plan.  Severity of Illness: The appropriate patient status for this patient is OBSERVATION. Observation status is judged to be reasonable and necessary in order to provide the required intensity of service to ensure the patient's safety. The patient's presenting symptoms, physical exam findings, and initial radiographic and laboratory data in the context of their medical condition is felt to place them at decreased risk for further clinical deterioration. Furthermore, it is anticipated that the patient will be medically stable for discharge from the hospital within 2 midnights of admission.   Author: Collier Bullock, MD 10/27/2021 4:20 PM  For on call review [email or web address].

## 2021-10-28 DIAGNOSIS — E785 Hyperlipidemia, unspecified: Secondary | ICD-10-CM | POA: Diagnosis present

## 2021-10-28 DIAGNOSIS — Z79899 Other long term (current) drug therapy: Secondary | ICD-10-CM | POA: Diagnosis not present

## 2021-10-28 DIAGNOSIS — E860 Dehydration: Secondary | ICD-10-CM | POA: Diagnosis present

## 2021-10-28 DIAGNOSIS — Z801 Family history of malignant neoplasm of trachea, bronchus and lung: Secondary | ICD-10-CM | POA: Diagnosis not present

## 2021-10-28 DIAGNOSIS — Z8546 Personal history of malignant neoplasm of prostate: Secondary | ICD-10-CM | POA: Diagnosis not present

## 2021-10-28 DIAGNOSIS — E876 Hypokalemia: Secondary | ICD-10-CM | POA: Diagnosis present

## 2021-10-28 DIAGNOSIS — Z833 Family history of diabetes mellitus: Secondary | ICD-10-CM | POA: Diagnosis not present

## 2021-10-28 DIAGNOSIS — E663 Overweight: Secondary | ICD-10-CM | POA: Diagnosis present

## 2021-10-28 DIAGNOSIS — Z6826 Body mass index (BMI) 26.0-26.9, adult: Secondary | ICD-10-CM | POA: Diagnosis not present

## 2021-10-28 DIAGNOSIS — E871 Hypo-osmolality and hyponatremia: Secondary | ICD-10-CM | POA: Diagnosis present

## 2021-10-28 DIAGNOSIS — N179 Acute kidney failure, unspecified: Secondary | ICD-10-CM | POA: Diagnosis present

## 2021-10-28 LAB — BASIC METABOLIC PANEL
Anion gap: 6 (ref 5–15)
Anion gap: 6 (ref 5–15)
BUN: 39 mg/dL — ABNORMAL HIGH (ref 8–23)
BUN: 36 mg/dL — ABNORMAL HIGH (ref 8–23)
CO2: 23 mmol/L (ref 22–32)
CO2: 25 mmol/L (ref 22–32)
Calcium: 8.5 mg/dL — ABNORMAL LOW (ref 8.9–10.3)
Calcium: 8.4 mg/dL — ABNORMAL LOW (ref 8.9–10.3)
Chloride: 108 mmol/L (ref 98–111)
Chloride: 109 mmol/L (ref 98–111)
Creatinine, Ser: 1.73 mg/dL — ABNORMAL HIGH (ref 0.61–1.24)
Creatinine, Ser: 1.46 mg/dL — ABNORMAL HIGH (ref 0.61–1.24)
GFR, Estimated: 43 mL/min — ABNORMAL LOW (ref >60)
GFR, Estimated: 53 mL/min — ABNORMAL LOW (ref >60)
Glucose, Bld: 120 mg/dL — ABNORMAL HIGH (ref 70–99)
Glucose, Bld: 126 mg/dL — ABNORMAL HIGH (ref 70–99)
Potassium: 3 mmol/L — ABNORMAL LOW (ref 3.5–5.1)
Potassium: 3.2 mmol/L — ABNORMAL LOW (ref 3.5–5.1)
Sodium: 137 mmol/L (ref 135–145)
Sodium: 140 mmol/L (ref 135–145)

## 2021-10-28 LAB — CBC
HCT: 32.1 % — ABNORMAL LOW (ref 39.0–52.0)
Hemoglobin: 10.9 g/dL — ABNORMAL LOW (ref 13.0–17.0)
MCH: 27.6 pg (ref 26.0–34.0)
MCHC: 34 g/dL (ref 30.0–36.0)
MCV: 81.3 fL (ref 80.0–100.0)
Platelets: 351 10*3/uL (ref 150–400)
RBC: 3.95 MIL/uL — ABNORMAL LOW (ref 4.22–5.81)
RDW: 12.8 % (ref 11.5–15.5)
WBC: 9.7 10*3/uL (ref 4.0–10.5)
nRBC: 0 % (ref 0.0–0.2)

## 2021-10-28 LAB — HIV ANTIBODY (ROUTINE TESTING W REFLEX): HIV Screen 4th Generation wRfx: NONREACTIVE

## 2021-10-28 MED ORDER — POTASSIUM CHLORIDE CRYS ER 20 MEQ PO TBCR
40.0000 meq | EXTENDED_RELEASE_TABLET | Freq: Once | ORAL | Status: AC
Start: 2021-10-28 — End: 2021-10-28
  Administered 2021-10-28: 40 meq via ORAL
  Filled 2021-10-28: qty 2

## 2021-10-28 MED ORDER — POTASSIUM CHLORIDE CRYS ER 20 MEQ PO TBCR
40.0000 meq | EXTENDED_RELEASE_TABLET | Freq: Two times a day (BID) | ORAL | Status: AC
Start: 2021-10-28 — End: 2021-10-29
  Administered 2021-10-28 – 2021-10-29 (×2): 40 meq via ORAL
  Filled 2021-10-28 (×2): qty 2

## 2021-10-28 MED ORDER — SODIUM CHLORIDE 0.9 % IV SOLN: INTRAVENOUS | Status: DC

## 2021-10-28 NOTE — Progress Notes (Signed)
[facility] Progress Note  Patient: Jackson [name]    BWG:[id number]  DOA: 10/27/2021    Date of Service: the patient was seen and examined on 10/28/2021  Brief hospital course: 66 year old male with past medical history of prostate cancer and dyslipidemia presented to the emergency room for abnormal lab work after being sent over by his PCP.  Patient had had Salmonella enteritis 2 weeks prior with daily diarrhea times almost 3 weeks.  When presenting to the emergency room, he still had some loose stools although they had started to become a little bit bulkier.  The emergency room, patient found to have a creatinine of 2.3 (baseline less than 1.0) and potassium at 2.5.  Patient brought in for IV fluids and electrolyte replacement.  After 24 hours of IV fluids running at 125 cc an hour, creatinine had improved and down to 1.45.  Assessment and Plan: Assessment and Plan: * AKI (acute kidney injury) ([place]) Prerenal and secondary to volume depletion from GI losses.  Patient's baseline creatinine is normal.  Creatinine of 1.2 seen several weeks ago secondary to his enteritis.  Will need another 12 to 24 hours of IV fluids to fully normalize creatinine.  Change lactated Ringer to normal saline  Hypokalemia Results of severe hypokalemia from GI losses.  Continue to replace.  Magnesium normal.  Hyponatremia with extracellular fluid depletion-resolved as of 10/28/2021 Noted to have hyponatremia secondary to volume depletion from GI source.  By 6/14, sodium up to 137.  Salmonella enteritis Patient diagnosed with Salmonella enteritis about 2 weeks ago and has completed his course of ciprofloxacin.  Stools are starting to bulk up  Overweight (BMI 25.0-29.9) Patient meets criteria for BMI greater than 25       Body mass index is 26.57 kg/m.        Consultants: None  Procedures: None  Antimicrobials: None  Code Status: Full code   Subjective: Patient with no complaints of this some  mild lightheadedness when he sits up  Objective: Vital signs were reviewed and unremarkable. Vitals:   10/28/21 0452 10/28/21 0844  BP: 121/70 123/70  Pulse: 65 (!) 58  Resp: 18 18  Temp: 98 F (36.7 C) 97.7 F (36.5 C)  SpO2: 99% 99%    Intake/Output Summary (Last 24 hours) at 10/28/2021 1713 Last data filed at 10/28/2021 1120 Gross per 24 hour  Intake 2187.31 ml  Output --  Net 2187.31 ml   Filed Weights   10/27/21 1316  Weight: 84 kg   Body mass index is 26.57 kg/m.  Exam:  General: Alert and oriented x3, no acute distress HEENT: Normocephalic and atraumatic, mucous [place] slightly dry Cardiovascular: Regular rate and rhythm, S1-S2 Respiratory: Clear to auscultation bilaterally Abdomen: Soft, nontender, nondistended, positive bowel sounds Musculoskeletal: No clubbing or cyanosis or edema Skin: No skin breaks, tears or lesions Psychiatry: Appropriate, no evidence of psychoses Neurology: No focal deficits  Data Reviewed: Labs from this morning and afternoon reviewed.  Improving creatinine although not yet back to baseline.  Potassium still less than 3.5.  Sodium normalized.  Disposition:  Status is: Inpatient    Anticipated discharge date: 6/15  Remaining issues to be resolved so that patient can be discharged: Correction of creatinine   Family Communication: Declined for me to call family DVT Prophylaxis: enoxaparin (LOVENOX) injection 40 mg Start: 10/27/21 2200    Author: Annita Brod ,MD 10/28/2021 5:13 PM  To reach On-call, see care teams to locate the attending and reach out via [email or web address]. Between  7PM-7AM, please contact night-coverage If you still have difficulty reaching the attending provider, please page the [facility] (Director on Call) for [facility] on amion for assistance.

## 2021-10-28 NOTE — Plan of Care (Signed)
  Problem: Education: Goal: Knowledge of General Education information will improve Description: Including pain rating scale, medication(s)/side effects and non-pharmacologic comfort measures Outcome: Progressing   Problem: Health Behavior/Discharge Planning: Goal: Ability to manage health-related needs will improve Outcome: Progressing   Problem: Clinical Measurements: Goal: Respiratory complications will improve Outcome: Progressing   Problem: Activity: Goal: Risk for activity intolerance will decrease Outcome: Progressing   Problem: Elimination: Goal: Will not experience complications related to bowel motility Outcome: Progressing   Problem: Elimination: Goal: Will not experience complications related to urinary retention Outcome: Progressing   Problem: Pain Managment: Goal: General experience of comfort will improve Outcome: Progressing

## 2021-10-28 NOTE — Hospital Course (Addendum)
66 year old male with past medical history of prostate cancer and dyslipidemia presented to the emergency room for abnormal lab work after being sent over by his PCP.  Patient had had Salmonella enteritis 2 weeks prior with daily diarrhea times almost 3 weeks.  When presenting to the emergency room, he still had some loose stools although they had started to become a little bit bulkier.  The emergency room, patient found to have a creatinine of 2.3 (baseline less than 1.0) and potassium at 2.5.  Patient brought in for IV fluids and electrolyte replacement.  After several days of IV fluids, creatinine down to 1.1.Marland Kitchen  Potassium normalized.

## 2021-10-28 NOTE — Assessment & Plan Note (Signed)
Patient meets criteria for BMI greater than 25

## 2021-10-29 DIAGNOSIS — E871 Hypo-osmolality and hyponatremia: Secondary | ICD-10-CM

## 2021-10-29 DIAGNOSIS — E663 Overweight: Secondary | ICD-10-CM

## 2021-10-29 DIAGNOSIS — E876 Hypokalemia: Secondary | ICD-10-CM

## 2021-10-29 DIAGNOSIS — N179 Acute kidney failure, unspecified: Principal | ICD-10-CM

## 2021-10-29 LAB — BASIC METABOLIC PANEL
Anion gap: 6 (ref 5–15)
BUN: 31 mg/dL — ABNORMAL HIGH (ref 8–23)
CO2: 23 mmol/L (ref 22–32)
Calcium: 8.7 mg/dL — ABNORMAL LOW (ref 8.9–10.3)
Chloride: 115 mmol/L — ABNORMAL HIGH (ref 98–111)
Creatinine, Ser: 1.18 mg/dL (ref 0.61–1.24)
GFR, Estimated: >60 mL/min (ref >60)
Glucose, Bld: 121 mg/dL — ABNORMAL HIGH (ref 70–99)
Potassium: 3.9 mmol/L (ref 3.5–5.1)
Sodium: 144 mmol/L (ref 135–145)

## 2021-10-29 NOTE — Telephone Encounter (Signed)
-----   Message from Noralyn Pick, NP sent at 10/27/2021  2:55 PM EDT -----    ----- Message ----- From: Tawnya Crook, MD Sent: 10/26/2021   9:06 PM EDT To: Noralyn Pick, NP

## 2021-10-29 NOTE — Telephone Encounter (Signed)
Pt contacted and was made aware of Carl Best NP recommendations: Pt stated that he is actually in the Hospital being treated for dehydration and plans to be discharged today. Pt stated that he has been there for 2 days; diarrhea has stopped and he feels much better: Pt was scheduled for an office visit on 12/07/2021 at 9:00 with Carl Best NP. Pt made aware. Pt verbalized understanding with all questions answered.

## 2021-10-29 NOTE — Discharge Summary (Signed)
Physician Discharge Summary   Patient: Jackson [name] MRN: [id number] DOB: 07-02-1955  Admit date:     10/27/2021  Discharge date: 10/29/21  Discharge Physician: Annita Brod   PCP: Vivi Barrack, MD   Recommendations at discharge:   Patient had been started on oral potassium recently secondary to hypokalemia and brought on by his diarrhea.  Not his electrolytes have normalized, this medication is being discontinued Follow-up with PCP as needed  Discharge Diagnoses: Active Problems:   Overweight (BMI 25.0-29.9)  Principal Problem (Resolved):   AKI (acute kidney injury) ([place]) Resolved Problems:   Hypokalemia   Hyponatremia with extracellular fluid depletion   Salmonella enteritis  Hospital Course: 66 year old male with past medical history of prostate cancer and dyslipidemia presented to the emergency room for abnormal lab work after being sent over by his PCP.  Patient had had Salmonella enteritis 2 weeks prior with daily diarrhea times almost 3 weeks.  When presenting to the emergency room, he still had some loose stools although they had started to become a little bit bulkier.  The emergency room, patient found to have a creatinine of 2.3 (baseline less than 1.0) and potassium at 2.5.  Patient brought in for IV fluids and electrolyte replacement.  After several days of IV fluids, creatinine down to 1.1.Marland Kitchen  Potassium normalized.  Assessment and Plan: * AKI (acute kidney injury) (HCC)-resolved as of 10/29/2021 Prerenal and secondary to volume depletion from GI losses.  Patient's baseline creatinine is normal.  Creatinine of 1.2 seen several weeks ago above baseline, secondary to his enteritis.  By 6/15, creatinine down to 1.18 with a BUN of 31.  Patient received 6 more hours of IV fluids and felt to be stable for discharge  Hypokalemia-resolved as of 10/29/2021 Results of severe hypokalemia from GI losses.  Replaced as needed.  Potassium on 6/15 at 3.9.  Magnesium  normal.  Hyponatremia with extracellular fluid depletion-resolved as of 10/28/2021 Noted to have hyponatremia secondary to volume depletion from GI source.  Resolved on hospital day 2.  Sodium 1 6/15 at 144  Salmonella enteritis-resolved as of 10/29/2021 Patient diagnosed with Salmonella enteritis about 2 weeks ago and has completed his course of ciprofloxacin.  Stools are starting to bulk up  Overweight (BMI 25.0-29.9) Patient meets criteria for BMI greater than 25         Consultants: None Procedures performed: None Disposition: Home Diet recommendation:  Discharge Diet Orders (From admission, onward)     Start     Ordered   10/29/21 0000  Diet - low sodium heart healthy        10/29/21 0921           Regular diet DISCHARGE MEDICATION: Allergies as of 10/29/2021   No Known Allergies      Medication List     STOP taking these medications    ciprofloxacin 500 MG tablet Commonly known as: Cipro   potassium chloride SA 20 MEQ tablet Commonly known as: KLOR-CON M       TAKE these medications    acetaminophen 500 MG tablet Commonly known as: TYLENOL Take 1,000 mg by mouth every 6 (six) hours as needed.   ADULT GUMMY PO Take 2 tablets by mouth daily.   atorvastatin 40 MG tablet Commonly known as: LIPITOR TAKE 1 TABLET BY MOUTH EVERY DAY   GREEN TEA PO Take 315 mg by mouth daily as needed (energy).   ondansetron 8 MG disintegrating tablet Commonly known as: ZOFRAN-ODT Take 1 tablet (8  mg total) by mouth every 8 (eight) hours as needed for nausea or vomiting.   vardenafil 20 MG tablet Commonly known as: Levitra Take 0.5-1 tablets (10-20 mg total) by mouth daily as needed for erectile dysfunction. What changed: how much to take        Discharge Exam: Filed Weights   10/27/21 1316  Weight: 84 kg   General: Alert and oriented x3, no acute distress Cardiovascular: Regular rate and rhythm, S1-S2 Lungs: Clear to auscultation  bilaterally  Condition at discharge: good  The results of significant diagnostics from this hospitalization (including imaging, microbiology, ancillary and laboratory) are listed below for reference.   Imaging Studies: CT Abdomen Pelvis W Contrast  Result Date: 10/13/2021 CLINICAL DATA:  Abdominal pain, acute, nonlocalized N/V/D, LLQ pain, h/o diverticulitis with perf s/p sigmoidectomy EXAM: CT ABDOMEN AND PELVIS WITH CONTRAST TECHNIQUE: Multidetector CT imaging of the abdomen and pelvis was performed using the standard protocol following bolus administration of intravenous contrast. RADIATION DOSE REDUCTION: This exam was performed according to the departmental dose-optimization program which includes automated exposure control, adjustment of the mA and/or kV according to patient size and/or use of iterative reconstruction technique. CONTRAST:  121m OMNIPAQUE IOHEXOL 300 MG/ML  SOLN COMPARISON:  11/14/2019 FINDINGS: Lower chest: No acute abnormality. Hepatobiliary: Unchanged too small to characterize low-density lesion of the right lobe. Cholecystectomy. No unexpected biliary dilatation. Pancreas: Unremarkable. No pancreatic ductal dilatation or surrounding inflammatory changes. Spleen: Normal in size without focal abnormality. Adrenals/Urinary Tract: Adrenals, kidneys, and bladder are unremarkable. Stomach/Bowel: Stomach is within normal limits. Bowel is normal in caliber. Normal appendix. Partial sigmoidectomy with anastomosis pelvis. Sigmoid diverticulosis is present. Fluid is present within the colon. Possible colonic mucosal hyperenhancement Vascular/Lymphatic: Atherosclerosis.  No enlarged nodes. Reproductive: Unremarkable. Other: No free fluid or free air. No acute abnormality of the abdominal wall. Musculoskeletal: Chronic L4 compression fracture. Lumbar spine degenerative changes. No acute osseous abnormality. IMPRESSION: Fluid is present within the colon distant with history of diarrhea. There  is possible colonic mucosal hyperenhancement likely on a infectious/inflammatory basis. Sigmoid colon postoperative changes. Remains sigmoid diverticular disease without acute diverticulitis. Aortic Atherosclerosis (ICD10-I70.0). Electronically Signed   By: PMacy MisM.D.   On: 10/13/2021 10:13    Microbiology: Results for orders placed or performed during the hospital encounter of 10/13/21  Stool culture     Status: Abnormal   Collection Time: 10/13/21  8:34 AM   Specimen: Stool  Result Value Ref Range Status   Salmonella/Shigella Screen Final report (A)  Corrected    Comment: CORRECTED ON 06/02 AT 1236: PREVIOUSLY REPORTED AS Preliminary report   Campylobacter Culture Final report  Final   E coli, Shiga toxin Assay Negative Negative Final    Comment: (NOTE) Performed At: [id number] Farmer[place] PWU:[id number]  STOOL CULTURE REFLEX - RSASHR     Status: Abnormal   Collection Time: 10/13/21  8:34 AM  Result Value Ref Range Status   Stool Culture result 1 (RSASHR) Comment (A)  Final    Comment: (NOTE) Presumptive positive for Salmonella - [facility]. This isolate (or specimen) was referred to your public health laboratory for additional testing and/or confirmation. The public health laboratory report will be available under a separate accession number. Uncomplicated gastroenteritis caused by nontyphoidal Salmonella spp. is self-limited and treated empirically with rehydration fluids and anti-motility agents. Routine susceptibility testing is not indicated for nontyphoidal Salmonella spp. isolated from intestinal sources (CLSI  2017). If a clinical need for susceptibility testing exists, please contact the laboratory within 7 days. Performed At: [facility] [address] [place], [place] [id number] Rush Farmer MD WU:[id number]   STOOL CULTURE Reflex - CMPCXR     Status: None   Collection  Time: 10/13/21  8:34 AM  Result Value Ref Range Status   Stool Culture result 1 (CMPCXR) Comment  Final    Comment: (NOTE) No Campylobacter species isolated. Performed At: [facility] [place], [place] [id number] Rush Farmer MD VH:[id number]     Labs: CBC: Recent Labs  Lab 10/26/21 1535 10/27/21 1332 10/28/21 0531  WBC 10.4 9.8 9.7  NEUTROABS 7.7 6.6  --   HGB 12.9* 12.8* 10.9*  HCT 37.8* 38.1* 32.1*  MCV 82.0 80.4 81.3  PLT 462.0* 424* 413   Basic Metabolic Panel: Recent Labs  Lab 10/26/21 1535 10/27/21 1332 10/27/21 1335 10/28/21 0531 10/28/21 1447 10/29/21 0434  NA 132* 132*  --  137 140 144  K 2.8* 2.5*  --  3.0* 3.2* 3.9  CL 97 97*  --  108 109 115*  CO2 23 24  --  '23 25 23  '$ GLUCOSE 116* 149*  --  120* 126* 121*  BUN 39* 41*  --  39* 36* 31*  CREATININE 2.14* 2.30*  --  1.73* 1.46* 1.18  CALCIUM 9.2 8.6*  --  8.5* 8.4* 8.7*  MG  --   --  2.4  --   --   --    Liver Function Tests: Recent Labs  Lab 10/26/21 1535  AST 32  ALT 44  ALKPHOS 105  BILITOT 0.5  PROT 7.7  ALBUMIN 3.8   CBG: No results for input(s): "GLUCAP" in the last 168 hours.  Discharge time spent: less than 30 minutes.  Signed: Annita Brod, MD [facility] 10/29/2021

## 2021-10-29 NOTE — Telephone Encounter (Signed)
Noted  

## 2021-10-30 ENCOUNTER — Telehealth: Payer: Self-pay

## 2021-10-30 ENCOUNTER — Other Ambulatory Visit (INDEPENDENT_AMBULATORY_CARE_PROVIDER_SITE_OTHER): Payer: Medicare Other

## 2021-10-30 DIAGNOSIS — E876 Hypokalemia: Secondary | ICD-10-CM

## 2021-10-30 DIAGNOSIS — R197 Diarrhea, unspecified: Secondary | ICD-10-CM

## 2021-10-30 LAB — BASIC METABOLIC PANEL
BUN: 16 mg/dL (ref 6–23)
CO2: 25 mEq/L (ref 19–32)
Calcium: 8.9 mg/dL (ref 8.4–10.5)
Chloride: 109 mEq/L (ref 96–112)
Creatinine, Ser: 1 mg/dL (ref 0.40–1.50)
GFR: 78.4 mL/min (ref >60.00)
Glucose, Bld: 150 mg/dL — ABNORMAL HIGH (ref 70–99)
Potassium: 3.5 mEq/L (ref 3.5–5.1)
Sodium: 144 mEq/L (ref 135–145)

## 2021-10-30 NOTE — Telephone Encounter (Signed)
Transition Care Management Follow-up Telephone Call Date of discharge and from where: [facility] 10/29/21 How have you been since you were released from the hospital? Doing good Any questions or concerns? No  Items Reviewed: Did the pt receive and understand the discharge instructions provided? Yes  Medications obtained and verified? Yes  Other? No  Any new allergies since your discharge? No  Dietary orders reviewed? Yes Do you have support at home? Yes   Home Care and Equipment/Supplies: Were home health services ordered? not applicable If so, what is the name of the agency?   Has the agency set up a time to come to the patient's home? not applicable Were any new equipment or medical supplies ordered?  No What is the name of the medical supply agency?  Were you able to get the supplies/equipment? not applicable Do you have any questions related to the use of the equipment or supplies? No  Functional Questionnaire: (I = Independent and D = Dependent) ADLs: I  Bathing/Dressing- I  Meal Prep- I  Eating- I  Maintaining continence- I  Transferring/Ambulation- I  Managing Meds- I  Follow up appointments reviewed:  PCP Hospital f/u appt confirmed? No   Specialist Hospital f/u appt confirmed? No  Are transportation arrangements needed? No  If their condition worsens, is the pt aware to call PCP or go to the Emergency Dept.? Yes Was the patient provided with contact information for the PCP's office or ED? Yes Was to pt encouraged to call back with questions or concerns? Yes

## 2021-10-31 ENCOUNTER — Other Ambulatory Visit: Payer: Self-pay | Admitting: Family Medicine

## 2021-10-31 DIAGNOSIS — E785 Hyperlipidemia, unspecified: Secondary | ICD-10-CM

## 2021-11-01 ENCOUNTER — Encounter: Payer: Self-pay | Admitting: Family Medicine

## 2021-11-02 ENCOUNTER — Other Ambulatory Visit: Payer: Self-pay | Admitting: *Deleted

## 2021-11-02 DIAGNOSIS — R7303 Prediabetes: Secondary | ICD-10-CM

## 2021-11-02 DIAGNOSIS — E876 Hypokalemia: Secondary | ICD-10-CM

## 2021-11-03 ENCOUNTER — Encounter: Payer: Self-pay | Admitting: Family Medicine

## 2021-11-03 ENCOUNTER — Other Ambulatory Visit (INDEPENDENT_AMBULATORY_CARE_PROVIDER_SITE_OTHER): Payer: Medicare Other

## 2021-11-03 DIAGNOSIS — E876 Hypokalemia: Secondary | ICD-10-CM | POA: Diagnosis not present

## 2021-11-03 LAB — BASIC METABOLIC PANEL
BUN: 15 mg/dL (ref 6–23)
CO2: 28 mEq/L (ref 19–32)
Calcium: 8.6 mg/dL (ref 8.4–10.5)
Chloride: 104 mEq/L (ref 96–112)
Creatinine, Ser: 1.01 mg/dL (ref 0.40–1.50)
GFR: 77.46 mL/min (ref >60.00)
Glucose, Bld: 158 mg/dL — ABNORMAL HIGH (ref 70–99)
Potassium: 4.1 mEq/L (ref 3.5–5.1)
Sodium: 140 mEq/L (ref 135–145)

## 2021-11-03 LAB — CBC WITH DIFFERENTIAL/PLATELET
Basophils Absolute: 0.1 10*3/uL (ref 0.0–0.1)
Basophils Relative: 1.8 % (ref 0.0–3.0)
Eosinophils Absolute: 0.2 10*3/uL (ref 0.0–0.7)
Eosinophils Relative: 3.1 % (ref 0.0–5.0)
HCT: 35.3 % — ABNORMAL LOW (ref 39.0–52.0)
Hemoglobin: 11.6 g/dL — ABNORMAL LOW (ref 13.0–17.0)
Lymphocytes Relative: 32.3 % (ref 12.0–46.0)
Lymphs Abs: 2.3 10*3/uL (ref 0.7–4.0)
MCHC: 32.9 g/dL (ref 30.0–36.0)
MCV: 84.1 fl (ref 78.0–100.0)
Monocytes Absolute: 0.3 10*3/uL (ref 0.1–1.0)
Monocytes Relative: 3.6 % (ref 3.0–12.0)
Neutro Abs: 4.1 10*3/uL (ref 1.4–7.7)
Neutrophils Relative %: 59.2 % (ref 43.0–77.0)
Platelets: 326 10*3/uL (ref 150.0–400.0)
RBC: 4.19 Mil/uL — ABNORMAL LOW (ref 4.22–5.81)
RDW: 13.3 % (ref 11.5–15.5)
WBC: 7 10*3/uL (ref 4.0–10.5)

## 2021-11-04 ENCOUNTER — Other Ambulatory Visit: Payer: Self-pay | Admitting: Family Medicine

## 2021-11-04 LAB — GASTROINTESTINAL PATHOGEN PNL
CampyloBacter Group: NOT DETECTED
Norovirus GI/GII: NOT DETECTED
Rotavirus A: NOT DETECTED
Salmonella species: NOT DETECTED
Shiga Toxin 1: NOT DETECTED
Shiga Toxin 2: NOT DETECTED
Shigella Species: NOT DETECTED
Vibrio Group: NOT DETECTED
Yersinia enterocolitica: NOT DETECTED

## 2021-11-04 LAB — TIQ-NTM

## 2021-11-04 LAB — C. DIFFICILE GDH AND TOXIN A/B
GDH ANTIGEN: NOT DETECTED
MICRO NUMBER:: 13547725
SPECIMEN QUALITY:: ADEQUATE
TOXIN A AND B: NOT DETECTED

## 2021-11-04 NOTE — Telephone Encounter (Signed)
Patient did not ask for refill, wanted to know if he still should be taking it after recent potassium labs.

## 2021-11-06 ENCOUNTER — Encounter: Payer: Self-pay | Admitting: Family Medicine

## 2021-11-10 ENCOUNTER — Encounter: Payer: Self-pay | Admitting: Family Medicine

## 2021-11-10 ENCOUNTER — Ambulatory Visit (INDEPENDENT_AMBULATORY_CARE_PROVIDER_SITE_OTHER): Payer: Medicare Other | Admitting: Family Medicine

## 2021-11-10 VITALS — BP 133/79 | HR 71 | Temp 98.6°F | Ht 70.0 in | Wt 194.0 lb

## 2021-11-10 DIAGNOSIS — R350 Frequency of micturition: Secondary | ICD-10-CM | POA: Diagnosis not present

## 2021-11-10 LAB — POCT URINALYSIS DIPSTICK
Bilirubin, UA: NEGATIVE
Blood, UA: NEGATIVE
Glucose, UA: NEGATIVE
Ketones, UA: NEGATIVE
Leukocytes, UA: NEGATIVE
Nitrite, UA: NEGATIVE
Protein, UA: NEGATIVE
Spec Grav, UA: 1.01 (ref 1.010–1.025)
Urobilinogen, UA: 0.2 E.U./dL
pH, UA: 6 (ref 5.0–8.0)

## 2021-11-10 MED ORDER — TAMSULOSIN HCL 0.4 MG PO CAPS: 0.4000 mg | ORAL_CAPSULE | Freq: Every day | ORAL | 1 refills | Status: DC

## 2021-11-11 LAB — URINE CULTURE
MICRO NUMBER:: 13578095
Result:: NO GROWTH
SPECIMEN QUALITY:: ADEQUATE

## 2021-12-03 ENCOUNTER — Other Ambulatory Visit: Payer: Self-pay | Admitting: Family Medicine

## 2021-12-04 ENCOUNTER — Ambulatory Visit (INDEPENDENT_AMBULATORY_CARE_PROVIDER_SITE_OTHER): Payer: Medicare Other | Admitting: Family

## 2021-12-04 ENCOUNTER — Encounter: Payer: Self-pay | Admitting: Family

## 2021-12-04 VITALS — BP 129/70 | HR 65 | Temp 97.9°F | Ht 70.0 in | Wt 192.1 lb

## 2021-12-04 DIAGNOSIS — T63463A Toxic effect of venom of wasps, assault, initial encounter: Secondary | ICD-10-CM | POA: Diagnosis not present

## 2021-12-04 DIAGNOSIS — T63443A Toxic effect of venom of bees, assault, initial encounter: Secondary | ICD-10-CM

## 2021-12-04 DIAGNOSIS — T63453A Toxic effect of venom of hornets, assault, initial encounter: Secondary | ICD-10-CM | POA: Diagnosis not present

## 2021-12-04 NOTE — Patient Instructions (Signed)
It was very nice to see you today!   Your sting site looks good, the swelling should decrease over the next few days, elevate your leg when ever resting.  OK to continue Claritin & Benadryl and can apply over the counter Hydrocortisone cream for the itching.  Have a great weekend!     PLEASE NOTE:  If you had any lab tests please let us know if you have not heard back within a few days. You may see your results on MyChart before we have a chance to review them but we will give you a call once they are reviewed by [place]. If we ordered any referrals today, please let us know if you have not heard from their office within the next week.

## 2021-12-04 NOTE — Progress Notes (Signed)
Patient ID: Jackson [name], male    DOB: 1955-07-21, 66 y.o.   MRN: [id number]  Chief Complaint  Patient presents with   Insect Bite    Pt c/o being stung on left foot on 7/17. Pt states his left foot has been swelling since. Has tried Claritin for allergies, Redness, was itchy but claritin has helped with the itch.     HPI: Yellow jacket sting: left lateral ankle, pt reports swelling with stiffness, and severe itching, denies any redness, shooting pain, tingling, fever, or body aches.  Assessment & Plan:  1. Sting from hornet, wasp, or bee, assault, initial encounter left lateral ankle, occurred 4 days ago. No severe reaction noted, advised to continue taking Claritin and Benadryl for next few days. Can apply ice for 20 min. tid to help swelling, elevate leg whenever resting.   Subjective:    Outpatient Medications Prior to Visit  Medication Sig Dispense Refill   acetaminophen (TYLENOL) 500 MG tablet Take 1,000 mg by mouth every 6 (six) hours as needed.     atorvastatin (LIPITOR) 40 MG tablet TAKE 1 TABLET BY MOUTH EVERY DAY 90 tablet 0   Green Tea, Camellia sinensis, (GREEN TEA PO) Take 315 mg by mouth daily as needed (energy).     Multiple Vitamins-Minerals (ADULT GUMMY PO) Take 2 tablets by mouth daily.     ondansetron (ZOFRAN-ODT) 8 MG disintegrating tablet Take 1 tablet (8 mg total) by mouth every 8 (eight) hours as needed for nausea or vomiting. 20 tablet 0   vardenafil (LEVITRA) 20 MG tablet Take 0.5-1 tablets (10-20 mg total) by mouth daily as needed for erectile dysfunction. (Patient taking differently: Take 10 mg by mouth daily as needed for erectile dysfunction.) 10 tablet 0   tamsulosin (FLOMAX) 0.4 MG CAPS capsule Take 1 capsule (0.4 mg total) by mouth daily. (Patient not taking: Reported on 12/04/2021) 30 capsule 1   No facility-administered medications prior to visit.   Past Medical History:  Diagnosis Date   Anginal pain ([place]) 2019   heat stress. stress test was  negative   Arthritis    Diverticulitis    Diverticulosis    HLD (hyperlipidemia)    Prostate cancer [facility])    Past Surgical History:  Procedure Laterality Date   CHOLECYSTECTOMY  05/18/2007   CYSTOSCOPY WITH STENT PLACEMENT N/A 03/28/2020   Procedure: FIREFLY INJECTION;  Surgeon: Ardis Hughs, MD;  Location: [facility];  Service: Urology;  Laterality: N/A;   EYE SURGERY     PROSTATECTOMY  2011   REFRACTIVE SURGERY Bilateral    eyes   TONSILLECTOMY     No Known Allergies    Objective:    Physical Exam Vitals and nursing note reviewed.  Constitutional:      General: He is not in acute distress.    Appearance: Normal appearance.  HENT:     Head: Normocephalic.  Cardiovascular:     Rate and Rhythm: Normal rate and regular rhythm.  Pulmonary:     Effort: Pulmonary effort is normal.     Breath sounds: Normal breath sounds.  Musculoskeletal:        General: Normal range of motion.     Cervical back: Normal range of motion.  Skin:    General: Skin is warm and dry.     Findings: Lesion (left lateral ankle, pinpoint red, no erythema or induration, mild swelling) present.  Neurological:     Mental Status: He is alert and oriented to person, place, and time.  Psychiatric:        Mood and Affect: Mood normal.   BP 129/70 (BP Location: Left Arm, Patient Position: Sitting, Cuff Size: Large)   Pulse 65   Temp 97.9 F (36.6 C) (Temporal)   Ht '5\' 10"'$  (1.778 m)   Wt 192 lb 2 oz (87.1 kg)   SpO2 99%   BMI 27.57 kg/m  Wt Readings from Last 3 Encounters:  12/04/21 192 lb 2 oz (87.1 kg)  11/10/21 194 lb (88 kg)  10/27/21 185 lb 3 oz (84 kg)       Jeanie Sewer, NP

## 2021-12-06 NOTE — Progress Notes (Signed)
12/06/2021 Jackson [name] [id number] Aug 17, 1955   Chief Complaint: Diarrhea follow-up  History of Present Illness: Jackson [name] is a 66 year old male with a past medical history of arthritis, diverticulitis s/p robotic assisted laparoscopic sigmoidectomy 03/28/2020 and prostate cancer s/p prostatectomy 2011 (no radiation or chemo). S/P cholecystectomy 2009.  He presents today for follow-up regarding infectious diarrhea.  He developed urgent diarrhea which progressively worsened and he presented to the [facility] on 10/13/2021.  He received IV fluids and KCl.  Stool cultures were positive for Salmonella, result received 12 days later and he was treated with an antibiotic. He was seen in the ED 10/27/2021 due to having persistent diarrhea and labs from his PCP showed a creatinine level of 2.3 and potassium level of 2.5. His received IV KCL and IV fluids. His hemoglobin level dropped from 12.8  -> 10.9 which was likely dilutional.  No hematochezia or melena.  His clinical status improved and he was discharged home on 10/29/2021.  His diarrhea completely abated within the next week.  The source of his Salmonella infection remains unknown.  Repeat GI pathogen panel 10/30/2021 was negative for Salmonella.  He is passing a normal formed brown bowel movement once or twice daily.  No rectal bleeding or black stools.  Since he was treated for Salmonella he complains of an odd metallic like taste in his mouth.  He is drinking at least 64 ounces of water daily.  He is eating a regular diet.  His most recent colonoscopy was 01/15/2020 which showed diverticulosis in the entire examined colon and internal hemorrhoids, no colitis or polyps.  No other complaints at this time.      Latest Ref Rng & Units 11/03/2021    8:07 AM 10/28/2021    5:31 AM 10/27/2021    1:32 PM  CBC  WBC 4.0 - 10.5 K/uL 7.0  9.7  9.8   Hemoglobin 13.0 - 17.0 g/dL 11.6  10.9  12.8   Hematocrit 39.0 - 52.0 % 35.3  32.1  38.1   Platelets  150.0 - 400.0 K/uL 326.0  351  424        Latest Ref Rng & Units 11/03/2021    8:07 AM 10/30/2021    9:06 AM 10/29/2021    4:34 AM  CMP  Glucose 70 - 99 mg/dL 158  150  121   BUN 6 - 23 mg/dL '15  16  31   '$ Creatinine 0.40 - 1.50 mg/dL 1.01  1.00  1.18   Sodium 135 - 145 mEq/L 140  144  144   Potassium 3.5 - 5.1 mEq/L 4.1  3.5  3.9   Chloride 96 - 112 mEq/L 104  109  115   CO2 19 - 32 mEq/L '28  25  23   '$ Calcium 8.4 - 10.5 mg/dL 8.6  8.9  8.7     Colonoscopy 01/15/2020: - Diverticulosis in the entire examined colon. Biopsied. - Non-bleeding internal hemorrhoids. - The examined portion of the ileum was  Current Outpatient Medications on File Prior to Visit  Medication Sig Dispense Refill   acetaminophen (TYLENOL) 500 MG tablet Take 1,000 mg by mouth every 6 (six) hours as needed.     atorvastatin (LIPITOR) 40 MG tablet TAKE 1 TABLET BY MOUTH EVERY DAY 90 tablet 0   Green Tea, Camellia sinensis, (GREEN TEA PO) Take 315 mg by mouth daily as needed (energy).     Multiple Vitamins-Minerals (ADULT GUMMY PO) Take 2 tablets by mouth daily.  ondansetron (ZOFRAN-ODT) 8 MG disintegrating tablet Take 1 tablet (8 mg total) by mouth every 8 (eight) hours as needed for nausea or vomiting. 20 tablet 0   vardenafil (LEVITRA) 20 MG tablet Take 0.5-1 tablets (10-20 mg total) by mouth daily as needed for erectile dysfunction. (Patient taking differently: Take 10 mg by mouth daily as needed for erectile dysfunction.) 10 tablet 0   No current facility-administered medications on file prior to visit.   No Known Allergies   Current Medications, Allergies, Past Medical History, Past Surgical History, Family History and Social History were reviewed in [employer] record.  Review of Systems:   Constitutional: Negative for fever, sweats, chills or weight loss.  Respiratory: Negative for shortness of breath.   Cardiovascular: Negative for chest pain, palpitations and leg swelling.   Gastrointestinal: See HPI.  Musculoskeletal: Negative for back pain or muscle aches.  Neurological: Negative for dizziness, headaches or paresthesias.   Physical Exam: BP 110/68 (BP Location: Left Arm, Patient Position: Sitting, Cuff Size: Normal)   Pulse 64   Ht 5' 9.25" (1.759 m) Comment: height measured without shoes  Wt 193 lb (87.5 kg)   BMI 28.30 kg/m   General: 66 year old male in no acute distress. Head: Normocephalic and atraumatic. Eyes: No scleral icterus. Conjunctiva pink . Ears: Normal auditory acuity. Mouth: Mild white coating to the posterior tongue. Lungs: Clear throughout to auscultation. Heart: Regular rate and rhythm, no murmur. Abdomen: Soft, nontender and nondistended. No masses or hepatomegaly. Normal bowel sounds x 4 quadrants.  Rectal: Deferred. Musculoskeletal: Symmetrical with no gross deformities. Extremities: No edema. Neurological: Alert oriented x 4. No focal deficits.  Psychological: Alert and cooperative. Normal mood and affect  Assessment and Recommendations:  36) 66 year old male with an infectious diarrhea, Salmonella was treated and resolved. -Patient to contact office if diarrhea recurs -Push fluids -Diet as tolerated  2) Change in taste.  Mild oral candidiasis on exam. -Nystatin 100,000 IU/ml 1 teaspoon swish and spit tid x 14 days  3) Anemia, likely dilutional secondary to aggressive IV fluids -CBC  4) History of diverticulitis with a probable contained perforation to the proximal to mid sigmoid colon most likely sequelae of prior diverticulitis 11/2019, s/p robotic assisted laparoscopic sigmoidectomy by Dr. Leighton Ruff on 73/41/9379  Today encounter was 25 minutes which included precharting, chart/result review, history/exam, face-to-face time used for counseling, formulating a treatment plan and documentation.

## 2021-12-07 ENCOUNTER — Ambulatory Visit: Payer: Medicare Other | Admitting: Nurse Practitioner

## 2021-12-07 ENCOUNTER — Encounter: Payer: Self-pay | Admitting: Nurse Practitioner

## 2021-12-07 ENCOUNTER — Other Ambulatory Visit: Payer: Medicare Other

## 2021-12-07 ENCOUNTER — Other Ambulatory Visit: Payer: Self-pay

## 2021-12-07 ENCOUNTER — Other Ambulatory Visit (INDEPENDENT_AMBULATORY_CARE_PROVIDER_SITE_OTHER): Payer: Medicare Other

## 2021-12-07 VITALS — BP 110/68 | HR 64 | Ht 69.25 in | Wt 193.0 lb

## 2021-12-07 DIAGNOSIS — A09 Infectious gastroenteritis and colitis, unspecified: Secondary | ICD-10-CM | POA: Diagnosis not present

## 2021-12-07 DIAGNOSIS — B370 Candidal stomatitis: Secondary | ICD-10-CM

## 2021-12-07 DIAGNOSIS — D649 Anemia, unspecified: Secondary | ICD-10-CM

## 2021-12-07 LAB — CBC
HCT: 36.4 % — ABNORMAL LOW (ref 39.0–52.0)
Hemoglobin: 12 g/dL — ABNORMAL LOW (ref 13.0–17.0)
MCHC: 32.9 g/dL (ref 30.0–36.0)
MCV: 84.2 fl (ref 78.0–100.0)
Platelets: 255 10*3/uL (ref 150.0–400.0)
RBC: 4.33 Mil/uL (ref 4.22–5.81)
RDW: 13.9 % (ref 11.5–15.5)
WBC: 7.5 10*3/uL (ref 4.0–10.5)

## 2021-12-07 MED ORDER — NYSTATIN 100000 UNIT/ML MT SUSP: 5.0000 mL | Freq: Three times a day (TID) | OROMUCOSAL | 0 refills | Status: DC

## 2021-12-07 NOTE — Patient Instructions (Addendum)
If you are age 67 or older, your body mass index should be between 23-30. Your Body mass index is 28.3 kg/m. If this is out of the aforementioned range listed, please consider follow up with your Primary Care Provider.  If you are age 66 or younger, your body mass index should be between 19-25. Your Body mass index is 28.3 kg/m. If this is out of the aformentioned range listed, please consider follow up with your Primary Care Provider.   ________________________________________________________  The [facility] providers would like to encourage you to use [facility] to communicate with providers for non-urgent requests or questions.  Due to long hold times on the telephone, sending your provider a message by [facility] may be a faster and more efficient way to get a response.  Please allow 48 business hours for a response.  Please remember that this is for non-urgent requests.  _______________________________________________________  Your provider has requested that you go to the basement level for lab work before leaving today. Press "B" on the elevator. The lab is located at the first door on the left as you exit the elevator.  We have sent the following medications to your pharmacy for you to pick up at your convenience: Nystatin    It was a pleasure to see you today!  Carl Best, CRNP

## 2021-12-15 LAB — HM DIABETES FOOT EXAM: HM Diabetic Foot Exam: NORMAL

## 2021-12-18 NOTE — Progress Notes (Signed)
Agree with the assessment and plan as outlined by Colleen Kennedy-Smith, NP.   Verdia Bolt, DO, FACG [facility]   

## 2021-12-23 ENCOUNTER — Telehealth: Payer: Self-pay | Admitting: Family Medicine

## 2021-12-23 NOTE — Telephone Encounter (Signed)
Jackson [name] with [facility] requests a PA for Labs done at [place] on October 30, 2021 from the ordering Provider in order for [employer] to cover Labs. Caller states CPT code for infectious agent detection by nucleic is 87506. Caller requests [facility] be called at ph# [phone] in order to get fax# to fax PA to.

## 2021-12-27 ENCOUNTER — Encounter: Payer: Self-pay | Admitting: Family Medicine

## 2021-12-28 NOTE — Telephone Encounter (Signed)
Can we have him schedule an appointment.  Algis Greenhouse. Jerline Pain, MD 12/28/2021 3:20 PM

## 2021-12-31 ENCOUNTER — Ambulatory Visit (INDEPENDENT_AMBULATORY_CARE_PROVIDER_SITE_OTHER): Payer: Medicare Other | Admitting: Family Medicine

## 2021-12-31 VITALS — BP 128/71 | HR 61 | Temp 98.8°F | Ht 69.0 in | Wt 191.4 lb

## 2021-12-31 DIAGNOSIS — K143 Hypertrophy of tongue papillae: Secondary | ICD-10-CM | POA: Diagnosis not present

## 2021-12-31 DIAGNOSIS — E785 Hyperlipidemia, unspecified: Secondary | ICD-10-CM

## 2021-12-31 DIAGNOSIS — R7303 Prediabetes: Secondary | ICD-10-CM | POA: Diagnosis not present

## 2021-12-31 MED ORDER — FLUCONAZOLE 100 MG PO TABS: 100.0000 mg | ORAL_TABLET | Freq: Every day | ORAL | 0 refills | Status: DC

## 2021-12-31 NOTE — Assessment & Plan Note (Signed)
Last A1c 6.0-do not think this is contributing to thrush.

## 2021-12-31 NOTE — Progress Notes (Signed)
   Jackson [name] is a 66 y.o. male who presents today for an office visit.  Assessment/Plan:  New/Acute Problems: Tongue Coating  He still has some residual metallic taste as well as white coating on his posterior tongue.  He has at an increased risk for thrush due to his recent course of antibiotics and illness.  He did not respond to nystatin.  We will try a course of fluconazole.  If symptoms persist despite this we will need to look for alternative etiologies and may need referral to ENT.  Does not have any other red flag signs or symptoms.  Would consider potentially trying a PPI for a few weeks if metallic taste persist to treat any possible underlying gastric reflux.  We discussed reasons to return to care.  Follow-up as needed.  Chronic Problems Addressed Today: Prediabetes Last A1c 6.0-do not think this is contributing to thrush.  Dyslipidemia Lipids at goal on Lipitor 40 mg daily.  Do not expect Lipitor would cause any taste or smell issues.     Subjective:  HPI:  Patient here with concern for thrush.  We last saw him a couple of months ago.  After our visit he was diagnosed with Salmonella enteritis.  He completed a course of antibiotics however was admitted to the hospital for a few days for conservative management due to rehydration.  He has mostly recovered from this however still has a metallic taste in his mouth that seems to persist.  This is better with eating pineapple.  He saw GI couple of weeks ago and was diagnosed with thrush.  He was started on nystatin.  Symptoms helped a little bit but still not resolved.  No fevers or chills.  No dysphagia.  No pain.       Objective:  Physical Exam: BP 128/71   Pulse 61   Temp 98.8 F (37.1 C) (Temporal)   Ht '5\' 9"'$  (1.753 m)   Wt 191 lb 6.4 oz (86.8 kg)   SpO2 98%   BMI 28.26 kg/m   Gen: No acute distress, resting comfortably HEENT: Posterior tongue with white coating CV: Regular rate and rhythm with no murmurs  appreciated Pulm: Normal work of breathing, clear to auscultation bilaterally with no crackles, wheezes, or rhonchi Neuro: Grossly normal, moves all extremities Psych: Normal affect and thought content      Blair Mesina M. Jerline Pain, MD 12/31/2021 3:37 PM

## 2021-12-31 NOTE — Assessment & Plan Note (Signed)
Lipids at goal on Lipitor 40 mg daily.  Do not expect Lipitor would cause any taste or smell issues.

## 2021-12-31 NOTE — Patient Instructions (Signed)
It was very nice to see you today!  Please start the Diflucan.  Let us know if not improving.  You can try taking a probiotic supplement to see if this helps as well.  Take care, Dr Jerline Pain  PLEASE NOTE:  If you had any lab tests please let us know if you have not heard back within a few days. You may see your results on mychart before we have a chance to review them but we will give you a call once they are reviewed by [place]. If we ordered any referrals today, please let us know if you have not heard from their office within the next week.   Please try these tips to maintain a healthy lifestyle:  Eat at least 3 REAL meals and 1-2 snacks per day.  Aim for no more than 5 hours between eating.  If you eat breakfast, please do so within one hour of getting up.   Each meal should contain half fruits/vegetables, one quarter protein, and one quarter carbs (no bigger than a computer mouse)  Cut down on sweet beverages. This includes juice, soda, and sweet tea.   Drink at least 1 glass of water with each meal and aim for at least 8 glasses per day  Exercise at least 150 minutes every week.

## 2022-01-02 NOTE — Telephone Encounter (Signed)
See note

## 2022-01-04 NOTE — Telephone Encounter (Signed)
I have called insurance to gather more info.  Ins. Stated they should have covered everything without PA.  I tried to connect Ins with [facility].  Ins disconnected.   I reached out to patient in regard.  Patient states the PA needs to be processed for the fecal test that was ordered by Dr. Cherlynn Kaiser. Can we please try to process PA?   Patient is going to follow up with Ins as well.

## 2022-01-19 ENCOUNTER — Other Ambulatory Visit (INDEPENDENT_AMBULATORY_CARE_PROVIDER_SITE_OTHER): Payer: Medicare Other

## 2022-01-19 DIAGNOSIS — D649 Anemia, unspecified: Secondary | ICD-10-CM

## 2022-01-19 LAB — CBC WITH DIFFERENTIAL/PLATELET
Basophils Absolute: 0.1 10*3/uL (ref 0.0–0.1)
Basophils Relative: 1.1 % (ref 0.0–3.0)
Eosinophils Absolute: 0.2 10*3/uL (ref 0.0–0.7)
Eosinophils Relative: 2.9 % (ref 0.0–5.0)
HCT: 36.5 % — ABNORMAL LOW (ref 39.0–52.0)
Hemoglobin: 12.2 g/dL — ABNORMAL LOW (ref 13.0–17.0)
Lymphocytes Relative: 46.6 % — ABNORMAL HIGH (ref 12.0–46.0)
Lymphs Abs: 3.4 10*3/uL (ref 0.7–4.0)
MCHC: 33.5 g/dL (ref 30.0–36.0)
MCV: 84.5 fl (ref 78.0–100.0)
Monocytes Absolute: 0.5 10*3/uL (ref 0.1–1.0)
Monocytes Relative: 7.2 % (ref 3.0–12.0)
Neutro Abs: 3.1 10*3/uL (ref 1.4–7.7)
Neutrophils Relative %: 42.2 % — ABNORMAL LOW (ref 43.0–77.0)
Platelets: 258 10*3/uL (ref 150.0–400.0)
RBC: 4.32 Mil/uL (ref 4.22–5.81)
RDW: 13.4 % (ref 11.5–15.5)
WBC: 7.4 10*3/uL (ref 4.0–10.5)

## 2022-01-19 LAB — VITAMIN B12: Vitamin B-12: 277 pg/mL (ref 211–911)

## 2022-01-19 LAB — IBC + FERRITIN
Ferritin: 190.6 ng/mL (ref 22.0–322.0)
Iron: 121 ug/dL (ref 42–165)
Saturation Ratios: 35 % (ref 20.0–50.0)
TIBC: 345.8 ug/dL (ref 250.0–450.0)
Transferrin: 247 mg/dL (ref 212.0–360.0)

## 2022-01-24 ENCOUNTER — Encounter: Payer: Self-pay | Admitting: Family Medicine

## 2022-01-26 ENCOUNTER — Encounter: Payer: Self-pay | Admitting: Family Medicine

## 2022-01-26 ENCOUNTER — Ambulatory Visit (INDEPENDENT_AMBULATORY_CARE_PROVIDER_SITE_OTHER): Payer: Medicare Other | Admitting: Family Medicine

## 2022-01-26 DIAGNOSIS — D649 Anemia, unspecified: Secondary | ICD-10-CM | POA: Diagnosis not present

## 2022-01-26 DIAGNOSIS — E538 Deficiency of other specified B group vitamins: Secondary | ICD-10-CM | POA: Diagnosis not present

## 2022-01-26 DIAGNOSIS — E785 Hyperlipidemia, unspecified: Secondary | ICD-10-CM

## 2022-01-26 MED ORDER — CYANOCOBALAMIN 1000 MCG/ML IJ SOLN
1000.0000 ug | Freq: Once | INTRAMUSCULAR | Status: AC
  Administered 2022-01-26: 1000 ug via INTRAMUSCULAR

## 2022-01-26 NOTE — Progress Notes (Signed)
   Jackson [name] is a 66 y.o. male who presents today for an office visit.  Assessment/Plan:  Chronic Problems Addressed Today: Normocytic anemia Has seen GI.  Found to have low normal B12.  Normal ferritin levels.  May be undergoing further work-up as soon.  We will start B12 protocol here today.  We can recheck again in about 3 months.  B12 deficiency Found to be low normal on recent labs from GI week ago.  They will let him start B12 injections.  We will give first injection today.  We will recheck B12 and CBC in about 3 months.  Dyslipidemia Stable on Lipitor 40 mg daily.  Does not need refill today.     Subjective:  HPI:  See a/p for status of chronic conditions.    Patient was last seen here about a month ago for follow-up for thrush secondary to Salmonella infection from a few months ago when he received course of antibiotics.  Has been following with GI and recently had lab work done about a week ago that showed low normal B12 and persistent normocytic anemia.  GI wanted him to have B12 injections and are considering work-up for atrophic gastritis.  He does feel somewhat tired at the end of the day.  The coating on his tongue seems to be improving.       Objective:  Physical Exam: BP 110/71   Pulse (!) 53   Temp (!) 97.2 F (36.2 C) (Temporal)   Ht '5\' 9"'$  (1.753 m)   Wt 191 lb (86.6 kg)   SpO2 100%   BMI 28.21 kg/m   Gen: No acute distress, resting comfortably CV: Regular rate and rhythm with no murmurs appreciated Pulm: Normal work of breathing, clear to auscultation bilaterally with no crackles, wheezes, or rhonchi Neuro: Grossly normal, moves all extremities Psych: Normal affect and thought content      Annalucia Laino M. Jerline Pain, MD 01/26/2022 9:05 AM

## 2022-01-26 NOTE — Assessment & Plan Note (Signed)
Found to be low normal on recent labs from GI week ago.  They will let him start B12 injections.  We will give first injection today.  We will recheck B12 and CBC in about 3 months.

## 2022-01-26 NOTE — Assessment & Plan Note (Signed)
Has seen GI.  Found to have low normal B12.  Normal ferritin levels.  May be undergoing further work-up as soon.  We will start B12 protocol here today.  We can recheck again in about 3 months.

## 2022-01-26 NOTE — Assessment & Plan Note (Signed)
Stable on Lipitor 40 mg daily.  Does not need refill today.

## 2022-01-26 NOTE — Patient Instructions (Signed)
It was very nice to see you today!  We will start the B12 shots here.  We should recheck again in about 3 months.    Take care, Dr Jerline Pain  PLEASE NOTE:  If you had any lab tests please let us know if you have not heard back within a few days. You may see your results on mychart before we have a chance to review them but we will give you a call once they are reviewed by [place]. If we ordered any referrals today, please let us know if you have not heard from their office within the next week.   Please try these tips to maintain a healthy lifestyle:  Eat at least 3 REAL meals and 1-2 snacks per day.  Aim for no more than 5 hours between eating.  If you eat breakfast, please do so within one hour of getting up.   Each meal should contain half fruits/vegetables, one quarter protein, and one quarter carbs (no bigger than a computer mouse)  Cut down on sweet beverages. This includes juice, soda, and sweet tea.   Drink at least 1 glass of water with each meal and aim for at least 8 glasses per day  Exercise at least 150 minutes every week.

## 2022-01-27 ENCOUNTER — Other Ambulatory Visit: Payer: Self-pay

## 2022-01-27 DIAGNOSIS — D649 Anemia, unspecified: Secondary | ICD-10-CM

## 2022-01-30 ENCOUNTER — Other Ambulatory Visit: Payer: Self-pay | Admitting: Family Medicine

## 2022-01-30 DIAGNOSIS — E785 Hyperlipidemia, unspecified: Secondary | ICD-10-CM

## 2022-02-02 ENCOUNTER — Ambulatory Visit (INDEPENDENT_AMBULATORY_CARE_PROVIDER_SITE_OTHER): Payer: Medicare Other

## 2022-02-02 DIAGNOSIS — E538 Deficiency of other specified B group vitamins: Secondary | ICD-10-CM | POA: Diagnosis not present

## 2022-02-02 MED ORDER — CYANOCOBALAMIN 1000 MCG/ML IJ SOLN
1000.0000 ug | Freq: Once | INTRAMUSCULAR | Status: AC
  Administered 2022-02-02: 1000 ug via INTRAMUSCULAR

## 2022-02-02 NOTE — Progress Notes (Signed)
Pt here for B12 injection for Dr Jerline Pain. Injection given in right deltoid. Pt tolerated well.

## 2022-02-05 ENCOUNTER — Other Ambulatory Visit: Payer: Medicare Other

## 2022-02-05 DIAGNOSIS — D649 Anemia, unspecified: Secondary | ICD-10-CM

## 2022-02-09 ENCOUNTER — Ambulatory Visit (INDEPENDENT_AMBULATORY_CARE_PROVIDER_SITE_OTHER): Payer: Medicare Other

## 2022-02-09 DIAGNOSIS — E538 Deficiency of other specified B group vitamins: Secondary | ICD-10-CM | POA: Diagnosis not present

## 2022-02-09 MED ORDER — CYANOCOBALAMIN 1000 MCG/ML IJ SOLN
1000.0000 ug | Freq: Once | INTRAMUSCULAR | Status: AC
  Administered 2022-02-09: 1000 ug via INTRAMUSCULAR

## 2022-02-09 NOTE — Progress Notes (Signed)
Jackson [name] 66 yr old male presents to office today for 3 of 4 weekly B12 injections per Dimas Chyle, MD. Administered CYANOCOBALAMIN 1,000 mcg IM right arm. Patient tolerated well.

## 2022-02-10 LAB — INTRINSIC FACTOR ANTIBODIES: Intrinsic Factor: NEGATIVE

## 2022-02-10 LAB — ANTI-PARIETAL ANTIBODY: PARIETAL CELL AB SCREEN: NEGATIVE

## 2022-02-12 ENCOUNTER — Encounter: Payer: Self-pay | Admitting: Family Medicine

## 2022-02-16 ENCOUNTER — Ambulatory Visit (INDEPENDENT_AMBULATORY_CARE_PROVIDER_SITE_OTHER): Payer: Medicare Other

## 2022-02-16 DIAGNOSIS — Z23 Encounter for immunization: Secondary | ICD-10-CM

## 2022-02-16 DIAGNOSIS — E538 Deficiency of other specified B group vitamins: Secondary | ICD-10-CM

## 2022-02-16 MED ORDER — CYANOCOBALAMIN 1000 MCG/ML IJ SOLN
1000.0000 ug | Freq: Once | INTRAMUSCULAR | Status: AC
  Administered 2022-02-16: 1000 ug via INTRAMUSCULAR

## 2022-02-16 NOTE — Progress Notes (Signed)
Jackson [name] 66 yr old male presents to office for 4th of 4 weekly B12 shots per Dimas Chyle, MD. Administered CYANOCOBALAMIN 1,000 mcg IM left arm along with flu shot per patient request. Patient tolerated well.

## 2022-03-03 ENCOUNTER — Ambulatory Visit (INDEPENDENT_AMBULATORY_CARE_PROVIDER_SITE_OTHER): Payer: Medicare Other | Admitting: Psychiatry

## 2022-03-03 DIAGNOSIS — Z630 Problems in relationship with spouse or partner: Secondary | ICD-10-CM

## 2022-03-03 DIAGNOSIS — F4323 Adjustment disorder with mixed anxiety and depressed mood: Secondary | ICD-10-CM
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
## 2022-03-10 ENCOUNTER — Ambulatory Visit (INDEPENDENT_AMBULATORY_CARE_PROVIDER_SITE_OTHER): Payer: Medicare Other | Admitting: Psychiatry

## 2022-03-10 DIAGNOSIS — F4323 Adjustment disorder with mixed anxiety and depressed mood: Secondary | ICD-10-CM | POA: Diagnosis not present

## 2022-03-10 DIAGNOSIS — Z87898 Personal history of other specified conditions: Secondary | ICD-10-CM | POA: Diagnosis not present

## 2022-03-10 DIAGNOSIS — Z630 Problems in relationship with spouse or partner: Secondary | ICD-10-CM | POA: Diagnosis not present
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
## 2022-03-16 ENCOUNTER — Ambulatory Visit (INDEPENDENT_AMBULATORY_CARE_PROVIDER_SITE_OTHER): Payer: Medicare Other

## 2022-03-16 DIAGNOSIS — E538 Deficiency of other specified B group vitamins: Secondary | ICD-10-CM | POA: Diagnosis not present

## 2022-03-16 MED ORDER — CYANOCOBALAMIN 1000 MCG/ML IJ SOLN
1000.0000 ug | Freq: Once | INTRAMUSCULAR | Status: AC
  Administered 2022-03-16: 1000 ug via INTRAMUSCULAR

## 2022-03-16 NOTE — Progress Notes (Signed)
Pt received b12 injection in rt deltoid, pt tolerated well.

## 2022-03-25 ENCOUNTER — Telehealth: Payer: Self-pay | Admitting: Pharmacist

## 2022-03-25 NOTE — Progress Notes (Signed)
[facility])                                            [place] Team    03/25/2022  Jackson [name] 1956/03/23 [id number]  Telephonic outreach made to Mr. Jackson [name] today to discuss atorvastatin 40 mg adherence, patient reports that he has two bottles of the atorvastatin 40 mg #90 on hand and is taking medication daily. Patient has a surplus due to holding medication for diverticulitis and treatment previously.   Loretha Brasil, PharmD [place] Pharmacist Office: [phone]

## 2022-03-31 ENCOUNTER — Ambulatory Visit (INDEPENDENT_AMBULATORY_CARE_PROVIDER_SITE_OTHER): Payer: Medicare Other | Admitting: Psychiatry

## 2022-03-31 ENCOUNTER — Encounter: Payer: Self-pay | Admitting: Family Medicine

## 2022-03-31 DIAGNOSIS — F4323 Adjustment disorder with mixed anxiety and depressed mood: Secondary | ICD-10-CM

## 2022-03-31 DIAGNOSIS — E538 Deficiency of other specified B group vitamins: Secondary | ICD-10-CM

## 2022-03-31 DIAGNOSIS — F331 Major depressive disorder, recurrent, moderate: Secondary | ICD-10-CM

## 2022-03-31 DIAGNOSIS — Z630 Problems in relationship with spouse or partner: Secondary | ICD-10-CM | POA: Diagnosis not present
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
## 2022-04-01 NOTE — Telephone Encounter (Signed)
He can start taking oral supplements 1000 mcg daily and he should come back in within the next few weeks to recheck B12.  Please place future order.

## 2022-04-07 ENCOUNTER — Ambulatory Visit: Payer: Medicare Other | Admitting: Psychiatry

## 2022-04-13 ENCOUNTER — Ambulatory Visit (INDEPENDENT_AMBULATORY_CARE_PROVIDER_SITE_OTHER): Payer: Medicare Other

## 2022-04-13 DIAGNOSIS — E538 Deficiency of other specified B group vitamins: Secondary | ICD-10-CM

## 2022-04-13 MED ORDER — CYANOCOBALAMIN 1000 MCG/ML IJ SOLN
1000.0000 ug | Freq: Once | INTRAMUSCULAR | Status: AC
  Administered 2022-04-13: 1000 ug via INTRAMUSCULAR

## 2022-04-13 NOTE — Progress Notes (Signed)
Pt here for B12 injection for Dr Jerline Pain. Injection given in Right deltoid. Pt tolerated well.

## 2022-04-27 ENCOUNTER — Ambulatory Visit (INDEPENDENT_AMBULATORY_CARE_PROVIDER_SITE_OTHER): Payer: Medicare Other | Admitting: Psychiatry

## 2022-04-27 DIAGNOSIS — F331 Major depressive disorder, recurrent, moderate: Secondary | ICD-10-CM | POA: Diagnosis not present

## 2022-04-27 DIAGNOSIS — Z630 Problems in relationship with spouse or partner: Secondary | ICD-10-CM | POA: Diagnosis not present

## 2022-04-27 DIAGNOSIS — F4323 Adjustment disorder with mixed anxiety and depressed mood: Secondary | ICD-10-CM
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
## 2022-04-28 ENCOUNTER — Other Ambulatory Visit: Payer: Self-pay | Admitting: *Deleted

## 2022-04-28 DIAGNOSIS — E538 Deficiency of other specified B group vitamins: Secondary | ICD-10-CM

## 2022-04-29 ENCOUNTER — Other Ambulatory Visit (INDEPENDENT_AMBULATORY_CARE_PROVIDER_SITE_OTHER): Payer: Medicare Other

## 2022-04-29 DIAGNOSIS — E538 Deficiency of other specified B group vitamins: Secondary | ICD-10-CM

## 2022-04-29 LAB — CBC
HCT: 42.3 % (ref 39.0–52.0)
Hemoglobin: 13.8 g/dL (ref 13.0–17.0)
MCHC: 32.7 g/dL (ref 30.0–36.0)
MCV: 85.1 fl (ref 78.0–100.0)
Platelets: 255 10*3/uL (ref 150.0–400.0)
RBC: 4.96 Mil/uL (ref 4.22–5.81)
RDW: 13.8 % (ref 11.5–15.5)
WBC: 8.4 10*3/uL (ref 4.0–10.5)

## 2022-04-29 LAB — VITAMIN B12: Vitamin B-12: 563 pg/mL (ref 211–911)

## 2022-04-30 NOTE — Progress Notes (Signed)
Please inform patient of the following:  B12 back to normal.  Blood counts are back to normal. It is okay for him to switch to oral supplementation.  Recommend starting B12 1000 mcg daily. We can recheck at his next office visit.

## 2022-05-06 ENCOUNTER — Encounter: Payer: Self-pay | Admitting: Family Medicine

## 2022-05-06 NOTE — Telephone Encounter (Signed)
Spoke with patient stated not Sx at this time   advise if Sx worsen call for appointment or go to UC/Virtual UC on my chart

## 2022-05-28 ENCOUNTER — Ambulatory Visit (INDEPENDENT_AMBULATORY_CARE_PROVIDER_SITE_OTHER): Payer: Medicare Other | Admitting: Psychiatry

## 2022-05-28 DIAGNOSIS — F4323 Adjustment disorder with mixed anxiety and depressed mood: Secondary | ICD-10-CM | POA: Diagnosis not present

## 2022-05-28 DIAGNOSIS — Z630 Problems in relationship with spouse or partner: Secondary | ICD-10-CM

## 2022-05-28 DIAGNOSIS — F331 Major depressive disorder, recurrent, moderate: Secondary | ICD-10-CM

## 2022-05-28 NOTE — Progress Notes (Signed)
Psychotherapy Progress Note [name] Jackson Moore, PhD LP  Patient ID: Jackson [name] [facility])    MRN: [id number] Therapy format: Individual psychotherapyneglected Date: 05/28/2022      Start: 9:13a     Stop: 10:00a     Time Spent: 47 min Location: In-person   Session narrative (presenting needs, interim history, self-report of stressors and symptoms, applications of prior therapy, status changes, and interventions made in session) Well enough holiday.  Some friction with Jackson [name], overreacting to questions.  Wound up contracting COVID before Christmas, 2nd time overall, which more or less forced a just-the-two-of-them holiday.  Close enough to observe that she does in fact tremor when not drinking.  Deal she made with Jackson [name] is holding to not drink when contracted to watch the kids, and Jackson [name] is holding to her standard to seek other care when she can't tell her mom is sober.  Issue remains of Jackson [name] complaining of many things -- his book, not going to her flight attendant school graduation, many other things.  Narrates some argument yesterday.  Refreshed tactics for turning complaint, cutting to the chase, challenging Jackson [name]'s perceptions.  As confrontation goes, he did reach the point of stating that he could invite friends over, but h would not want to expose them -- or her -- to the embarrassment.  Notes a lifestyle complication that Jackson [name] is hypersensitive to temperature and has been overmanaging climate controls.  Often means he has to be ready to get up in the night to change the thermostat once she's asleep.  Encouraged to work out another solution that will not interrupt his sleep, be it compromise, or layers.  Advocated more doggedly responding to her complaints using (1) agreeing in the abstract (if that happened to me, I'd feel the same way) and (2) moving her to speak directly for what she wants more than making complaints, even if it's obvious.    Further along with  writing project.  Affirmed and encouraged.  Therapeutic modalities: Cognitive Behavioral Therapy, Solution-Oriented/Positive Psychology, Ego-Supportive, and Assertiveness/Communication  Mental Status/Observations:  Appearance:   Casual     Behavior:  Appropriate  Motor:  Normal  Speech/Language:   Clear and Coherent  Affect:  Appropriate  Mood:  Even, frustrated  Thought process:  normal  Thought content:    WNL  Sensory/Perceptual disturbances:    WNL  Orientation:  Fully oriented  Attention:  Good    Concentration:  Good  Memory:  WNL  Insight:    Good  Judgment:   Good  Impulse Control:  Good   Risk Assessment: Danger to Self: No Self-injurious Behavior: No Danger to Others: No Physical Aggression / Violence: No Duty to Warn: No Access to Firearms a concern: No  Assessment of progress:  progressing  Diagnosis:   ICD-10-CM   1. Major depressive disorder, recurrent episode, moderate (HCC)  F33.1     2. Relationship problem between partners  Z63.0     3. Adjustment disorder with mixed anxiety and depressed mood  F43.23      Plan:  Al-Anon offered for further moral support.  Self-affirm he is generally pursuing healthy boundaries and doing a good job of being stoic and grounded where needed. Communication tactics for recruiting best chance of Jackson [name] hearing his concerns.  Otherwise don't try to argue when she is intoxicated, just use own presence and reactions to validate or frustrate her behavior If safety issue, be willing to call law enforcement to take Jackson [name] off the road. 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  Ideas for protecting time he wants to focus or take time out. Other recommendations/advice as may be noted above Continue to utilize previously learned skills ad lib Maintain medication as prescribed and work  faithfully with relevant prescriber(s) if any changes are desired or seem indicated Call the clinic on-call service, 988/hotline, 911, or present to [facility] or ER if any life-threatening psychiatric crisis Return for as already scheduled, time as already scheduled. Already scheduled visit in this office 06/29/2022.  Jackson Serve, PhD Jackson Moore, PhD LP Clinical Psychologist, [name] [address] [place], [place] [address] [phone]

## 2022-06-29 ENCOUNTER — Ambulatory Visit (INDEPENDENT_AMBULATORY_CARE_PROVIDER_SITE_OTHER): Payer: Medicare Other | Admitting: Psychiatry

## 2022-06-29 DIAGNOSIS — Z630 Problems in relationship with spouse or partner: Secondary | ICD-10-CM

## 2022-06-29 DIAGNOSIS — F4323 Adjustment disorder with mixed anxiety and depressed mood: Secondary | ICD-10-CM | POA: Diagnosis not present

## 2022-06-29 DIAGNOSIS — F331 Major depressive disorder, recurrent, moderate: Secondary | ICD-10-CM
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
## 2022-07-06 ENCOUNTER — Ambulatory Visit (INDEPENDENT_AMBULATORY_CARE_PROVIDER_SITE_OTHER): Payer: Medicare Other | Admitting: Family Medicine

## 2022-07-06 ENCOUNTER — Encounter: Payer: Self-pay | Admitting: Family Medicine

## 2022-07-06 VITALS — BP 137/79 | HR 56 | Temp 97.7°F | Ht 69.0 in | Wt 197.2 lb

## 2022-07-06 DIAGNOSIS — C61 Malignant neoplasm of prostate: Secondary | ICD-10-CM | POA: Diagnosis not present

## 2022-07-06 DIAGNOSIS — R7303 Prediabetes: Secondary | ICD-10-CM

## 2022-07-06 DIAGNOSIS — R350 Frequency of micturition: Secondary | ICD-10-CM | POA: Diagnosis not present

## 2022-07-06 LAB — POCT URINALYSIS DIPSTICK
Bilirubin, UA: NEGATIVE
Blood, UA: NEGATIVE
Glucose, UA: NEGATIVE
Ketones, UA: NEGATIVE
Nitrite, UA: NEGATIVE
Protein, UA: NEGATIVE
Spec Grav, UA: 1.025 (ref 1.010–1.025)
Urobilinogen, UA: 0.2 E.U./dL
pH, UA: 5.5 (ref 5.0–8.0)

## 2022-07-06 MED ORDER — TAMSULOSIN HCL 0.4 MG PO CAPS: 0.4000 mg | ORAL_CAPSULE | Freq: Every day | ORAL | 3 refills | Status: DC

## 2022-07-06 NOTE — Assessment & Plan Note (Signed)
His last couple of PSAs have been at the undetectable range.  It is possible that he may have some urethral stricture or scarring related to previous procedure causing above urinary frequency.  If culture is negative we will need to be referred back to urology.

## 2022-07-06 NOTE — Assessment & Plan Note (Signed)
Last A1c 6.0 and no glucosuria on UA today doubt this is contributing to his urinary frequency.

## 2022-07-06 NOTE — Progress Notes (Signed)
   Jackson [name] is a 67 y.o. male who presents today for an office visit.  Assessment/Plan:  New/Acute Problems: Urinary Frequency No red flags.  UA today with positive leukocytes though no other symptoms concerning for UTI.  He does have a history of prostate cancer status post prostatectomy several years ago.  He may be having urethral stricture.  We will check urine culture today.  If positive will treat accordingly.  If negative will need to be referred back to urology for rule out urethral stricture and possible urodynamic studies to look for other possible causes.  He has done well with Flomax in the past and we will restart today.  We did discuss potential side effects.  Chronic Problems Addressed Today: Prostate cancer [name]) s/p prostatectomy His last couple of PSAs have been at the undetectable range.  It is possible that he may have some urethral stricture or scarring related to previous procedure causing above urinary frequency.  If culture is negative we will need to be referred back to urology.  Prediabetes Last A1c 6.0 and no glucosuria on UA today doubt this is contributing to his urinary frequency.     Subjective:  HPI:  See A/p for status of chronic conditions.  His main concern today is urinary frequency. This has been going for about 7 weeks. Started after getting covid. Overall stable the last few weeks. Better if cutting down fluid in the evening. Has hard time with flow first thing in the morning. No fevers or chills. No hematuria. No dysuria.  He had similar symptoms about 9 months ago.  Was seen by different provider here.  Started on Flomax which helped with symptoms.  Symptoms had resolved until recurring a couple of months ago.       Objective:  Physical Exam: BP 137/79   Pulse (!) 56   Temp 97.7 F (36.5 C) (Temporal)   Ht 5' 9"$  (1.753 m)   Wt 197 lb 3.2 oz (89.4 kg)   SpO2 99%   BMI 29.12 kg/m   Gen: No acute distress, resting comfortably CV: Regular  rate and rhythm with no murmurs appreciated Pulm: Normal work of breathing, clear to auscultation bilaterally with no crackles, wheezes, or rhonchi MSK: - Back: No deformities. No CVA tenderness.  Neuro: Grossly normal, moves all extremities Psych: Normal affect and thought content      Luiza Carranco M. Jerline Pain, MD 07/06/2022 9:29 AM

## 2022-07-06 NOTE — Patient Instructions (Signed)
It was very nice to see you today!  We will check a urine culture today.  If you have a UTI we will treat with antibiotics.  If your culture is negative we will need to have you follow back up with the urologist.  Please start with the Flomax to help with the symptoms.  Take care, Dr Jerline Pain  PLEASE NOTE:  If you had any lab tests, please let us know if you have not heard back within a few days. You may see your results on mychart before we have a chance to review them but we will give you a call once they are reviewed by [place].   If we ordered any referrals today, please let us know if you have not heard from their office within the next week.   If you had any urgent prescriptions sent in today, please check with the pharmacy within an hour of our visit to make sure the prescription was transmitted appropriately.   Please try these tips to maintain a healthy lifestyle:  Eat at least 3 REAL meals and 1-2 snacks per day.  Aim for no more than 5 hours between eating.  If you eat breakfast, please do so within one hour of getting up.   Each meal should contain half fruits/vegetables, one quarter protein, and one quarter carbs (no bigger than a computer mouse)  Cut down on sweet beverages. This includes juice, soda, and sweet tea.   Drink at least 1 glass of water with each meal and aim for at least 8 glasses per day  Exercise at least 150 minutes every week.

## 2022-07-07 ENCOUNTER — Other Ambulatory Visit: Payer: Self-pay

## 2022-07-07 DIAGNOSIS — R350 Frequency of micturition: Secondary | ICD-10-CM

## 2022-07-07 NOTE — Addendum Note (Signed)
Addended by: Jodell Cipro on: 07/07/2022 08:39 AM   Modules accepted: Orders

## 2022-07-08 LAB — URINE CULTURE
MICRO NUMBER:: 14595336
Result:: NO GROWTH
SPECIMEN QUALITY:: ADEQUATE

## 2022-07-09 NOTE — Progress Notes (Signed)
Please inform patient of the following:  His urine culture is negative.  No signs of UTI.  We need to have him follow back up with urology as we discussed at his office visit.  Please place referral if needed.

## 2022-07-13 ENCOUNTER — Other Ambulatory Visit: Payer: Self-pay

## 2022-07-13 DIAGNOSIS — R350 Frequency of micturition: Secondary | ICD-10-CM

## 2022-07-13 DIAGNOSIS — C61 Malignant neoplasm of prostate: Secondary | ICD-10-CM

## 2022-07-27 ENCOUNTER — Ambulatory Visit (INDEPENDENT_AMBULATORY_CARE_PROVIDER_SITE_OTHER): Payer: Medicare Other | Admitting: Psychiatry

## 2022-07-27 DIAGNOSIS — Z87898 Personal history of other specified conditions: Secondary | ICD-10-CM | POA: Diagnosis not present

## 2022-07-27 DIAGNOSIS — Z630 Problems in relationship with spouse or partner: Secondary | ICD-10-CM | POA: Diagnosis not present

## 2022-07-27 DIAGNOSIS — F4323 Adjustment disorder with mixed anxiety and depressed mood: Secondary | ICD-10-CM | POA: Diagnosis not present

## 2022-07-27 DIAGNOSIS — F331 Major depressive disorder, recurrent, moderate: Secondary | ICD-10-CM | POA: Diagnosis not present
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
## 2022-08-24 ENCOUNTER — Ambulatory Visit: Payer: Medicare Other | Admitting: Psychiatry

## 2022-08-24 DIAGNOSIS — F4323 Adjustment disorder with mixed anxiety and depressed mood: Secondary | ICD-10-CM

## 2022-08-24 DIAGNOSIS — Z87898 Personal history of other specified conditions: Secondary | ICD-10-CM | POA: Diagnosis not present

## 2022-08-24 DIAGNOSIS — Z630 Problems in relationship with spouse or partner: Secondary | ICD-10-CM | POA: Diagnosis not present

## 2022-08-24 DIAGNOSIS — F331 Major depressive disorder, recurrent, moderate: Secondary | ICD-10-CM | POA: Diagnosis not present
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
## 2022-09-13 ENCOUNTER — Telehealth: Payer: Self-pay | Admitting: Family Medicine

## 2022-09-13 NOTE — Telephone Encounter (Signed)
Copied from CRM [id number]. Topic: Medicare AWV >> Sep 13, 2022 10:51 AM Gwenith Spitz wrote: Called patient to reschedule Medicare Annual Wellness Visit (AWV). Left message for patient to call back and reschedule Medicare Annual Wellness Visit (AWV).  Last date of AWV: 09/18/2021  Please schedule an appointment at any time with Inetta Fermo, [facility]. Please schedule AWVS with Inetta Fermo, [facility].  If any questions, please contact me at [phone].  Thank you ,  [name] [facility] AWV TEAM Direct Dial [phone]

## 2022-09-16 ENCOUNTER — Telehealth: Payer: Self-pay | Admitting: Family Medicine

## 2022-09-16 NOTE — Telephone Encounter (Signed)
Contacted Jackson [name] to schedule their annual wellness visit. Appointment made for 09/28/2022.  [name] [facility] AWV TEAM Direct Dial [phone]

## 2022-09-21 ENCOUNTER — Ambulatory Visit: Payer: Medicare Other | Admitting: Psychiatry

## 2022-09-21 DIAGNOSIS — Z87898 Personal history of other specified conditions: Secondary | ICD-10-CM | POA: Diagnosis not present

## 2022-09-21 DIAGNOSIS — Z630 Problems in relationship with spouse or partner: Secondary | ICD-10-CM | POA: Diagnosis not present

## 2022-09-21 DIAGNOSIS — F331 Major depressive disorder, recurrent, moderate: Secondary | ICD-10-CM

## 2022-09-21 DIAGNOSIS — F4323 Adjustment disorder with mixed anxiety and depressed mood: Secondary | ICD-10-CM
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
## 2022-09-28 ENCOUNTER — Ambulatory Visit (INDEPENDENT_AMBULATORY_CARE_PROVIDER_SITE_OTHER): Payer: Medicare Other

## 2022-09-28 VITALS — Wt 190.0 lb

## 2022-09-28 DIAGNOSIS — Z0000 Encounter for general adult medical examination without abnormal findings: Secondary | ICD-10-CM | POA: Diagnosis not present

## 2022-09-28 NOTE — Progress Notes (Signed)
I connected with  Jackson [name] on 09/28/22 by a audio enabled telemedicine application and verified that I am speaking with the correct person using two identifiers.  Patient Location: Home  Provider Location: Office/Clinic  I discussed the limitations of evaluation and management by telemedicine. The patient expressed understanding and agreed to proceed.  Patient Medicare AWV questionnaire was completed by the patient on 09/27/22; I have confirmed that all information answered by patient is correct and no changes since this date.      Subjective:   Jackson [name] is a 67 y.o. male who presents for Medicare Annual/Subsequent preventive examination.  Review of Systems     Cardiac Risk Factors include: advanced age (>75men, >51 women);male gender;dyslipidemia;sedentary lifestyle     Objective:    Today's Vitals   09/28/22 1536  Weight: 190 lb (86.2 kg)   Body mass index is 28.06 kg/m.     09/28/2022    3:42 PM 10/27/2021   10:12 PM 10/13/2021    8:29 AM 09/18/2021    3:06 PM 10/01/2020    8:39 AM 03/28/2020    1:44 PM 03/19/2020    9:10 AM  Advanced Directives  Does Patient Have a Medical Advance Directive? No No No No No No No  Would patient like information on creating a medical advance directive? No - Patient declined No - Patient declined No - Patient declined No - Patient declined Yes (MAU/Ambulatory/Procedural Areas - Information given) No - Patient declined No - Patient declined    Current Medications (verified) Outpatient Encounter Medications as of 09/28/2022  Medication Sig   acetaminophen (TYLENOL) 500 MG tablet Take 1,000 mg by mouth every 6 (six) hours as needed.   atorvastatin (LIPITOR) 40 MG tablet TAKE 1 TABLET BY MOUTH EVERY DAY   Green Tea, Camellia sinensis, (GREEN TEA PO) Take 315 mg by mouth daily as needed (energy).   Multiple Vitamins-Minerals (ADULT GUMMY PO) Take 2 tablets by mouth daily.   ondansetron (ZOFRAN-ODT) 8 MG disintegrating tablet Take 1 tablet (8  mg total) by mouth every 8 (eight) hours as needed for nausea or vomiting.   vardenafil (LEVITRA) 20 MG tablet Take 0.5-1 tablets (10-20 mg total) by mouth daily as needed for erectile dysfunction. (Patient taking differently: Take 10 mg by mouth daily as needed for erectile dysfunction.)   [DISCONTINUED] tamsulosin (FLOMAX) 0.4 MG CAPS capsule Take 1 capsule (0.4 mg total) by mouth daily.   No facility-administered encounter medications on file as of 09/28/2022.    Allergies (verified) Patient has no known allergies.   History: Past Medical History:  Diagnosis Date   Anginal pain (HCC) 2019   heat stress. stress test was negative   Arthritis    Diverticulitis    Diverticulosis    HLD (hyperlipidemia)    Prostate cancer [facility])    Past Surgical History:  Procedure Laterality Date   CHOLECYSTECTOMY  05/18/2007   CYSTOSCOPY WITH STENT PLACEMENT N/A 03/28/2020   Procedure: FIREFLY INJECTION;  Surgeon: Crist Fat, MD;  Location: [facility];  Service: Urology;  Laterality: N/A;   EYE SURGERY     PROSTATECTOMY  2011   REFRACTIVE SURGERY Bilateral    eyes   TONSILLECTOMY     Family History  Problem Relation Age of Onset   Lung cancer Mother    Depression Mother    Diabetes Brother    Alcohol abuse Brother    Depression Brother    Early death Father    Arthritis Father    Alcohol  abuse Son    Cancer Maternal Grandfather        type unknown   Colon cancer Neg Hx    Esophageal cancer Neg Hx    Rectal cancer Neg Hx    Stomach cancer Neg Hx    Social History   Socioeconomic History   Marital status: Married    Spouse name: Not on file   Number of children: 2   Years of education: Not on file   Highest education level: Associate degree: occupational, Scientist, product/process development, or vocational program  Occupational History   Occupation: retired  Tobacco Use   Smoking status: Never   Smokeless tobacco: Never  Vaping Use   Vaping Use: Never used  Substance and Sexual Activity    Alcohol use: Yes    Comment: beer every 6 months   Drug use: Never   Sexual activity: Not on file  Other Topics Concern   Not on file  Social History Narrative   Not on file   Social Determinants of Health   Financial Resource Strain: Low Risk  (09/27/2022)   Overall Financial Resource Strain (CARDIA)    Difficulty of Paying Living Expenses: Not hard at all  Food Insecurity: No Food Insecurity (09/27/2022)   Hunger Vital Sign    Worried About Running Out of Food in the Last Year: Never true    Ran Out of Food in the Last Year: Never true  Transportation Needs: No Transportation Needs (09/27/2022)   PRAPARE - Administrator, Civil Service (Medical): No    Lack of Transportation (Non-Medical): No  Physical Activity: Sufficiently Active (09/27/2022)   Exercise Vital Sign    Days of Exercise per Week: 4 days    Minutes of Exercise per Session: 50 min  Stress: No Stress Concern Present (09/27/2022)   [employer] of Occupational Health - Occupational Stress Questionnaire    Feeling of Stress : Not at all  Social Connections: Socially Isolated (09/27/2022)   Social Connection and Isolation Panel [NHANES]    Frequency of Communication with Friends and Family: Once a week    Frequency of Social Gatherings with Friends and Family: Once a week    Attends Religious Services: Never    Database administrator or Organizations: No    Attends Engineer, structural: Never    Marital Status: Married    Tobacco Counseling Counseling given: Not Answered   Clinical Intake:  Pre-visit preparation completed: Yes  Pain : No/denies pain     BMI - recorded: 28.06 Nutritional Status: BMI 25 -29 Overweight Nutritional Risks: None Diabetes: No  How often do you need to have someone help you when you read instructions, pamphlets, or other written materials from your doctor or pharmacy?: 1 - Never  Diabetic?no  Interpreter Needed?: No  Information entered by :: Lanier Ensign, LPN   Activities of Daily Living    09/27/2022    6:11 PM 10/27/2021   10:12 PM  In your present state of health, do you have any difficulty performing the following activities:  Hearing? 0 0  Vision? 0 0  Difficulty concentrating or making decisions? 0 0  Walking or climbing stairs? 0 0  Dressing or bathing? 0 0  Doing errands, shopping? 0 0  Preparing Food and eating ? N   Using the Toilet? N   In the past six months, have you accidently leaked urine? N   Do you have problems with loss of bowel control? N  Managing your Medications? N   Managing your Finances? N   Housekeeping or managing your Housekeeping? N     Patient Care Team: Ardith Dark, MD as PCP - General (Family Medicine)  Indicate any recent Medical Services you may have received from other than [facility] providers in the past year (date may be approximate).     Assessment:   This is a routine wellness examination for [place].  Hearing/Vision screen Hearing Screening - Comments:: Pt denies any hearing issues  Vision Screening - Comments:: Pt [facility] for [facility]   Dietary issues and exercise activities discussed: Current Exercise Habits: Home exercise routine, Type of exercise: Other - see comments;walking, Time (Minutes): 60, Frequency (Times/Week): 4, Weekly Exercise (Minutes/Week): 240   Goals Addressed             This Visit's Progress    Patient Stated       Exercise more        Depression Screen    09/28/2022    3:40 PM 07/06/2022    8:45 AM 01/26/2022    8:44 AM 12/31/2021    2:52 PM 12/31/2021    2:51 PM 09/18/2021    3:04 PM 08/18/2021    8:20 AM  PHQ 2/9 Scores  PHQ - 2 Score 0 0 0 0 0 0 0  PHQ- 9 Score  0         Fall Risk    09/27/2022    6:11 PM 07/06/2022    8:45 AM 01/26/2022    8:44 AM 12/31/2021    2:52 PM 09/18/2021    3:07 PM  Fall Risk   Falls in the past year? 0 0 0 0 0  Number falls in past yr: 0 0 0 0 0  Injury with Fall? 0 0 0 0 0   Risk for fall due to : Impaired vision No Fall Risks No Fall Risks No Fall Risks   Follow up Falls prevention discussed    Falls prevention discussed    FALL RISK PREVENTION PERTAINING TO THE HOME:  Any stairs in or around the home? Yes  If so, are there any without handrails? No  Home free of loose throw rugs in walkways, pet beds, electrical cords, etc? Yes  Adequate lighting in your home to reduce risk of falls? Yes   ASSISTIVE DEVICES UTILIZED TO PREVENT FALLS:  Life alert? No  Use of a cane, walker or w/c? No  Grab bars in the bathroom? Yes  Shower chair or bench in shower? Yes  Elevated toilet seat or a handicapped toilet? No   TIMED UP AND GO:  Was the test performed? No .  Cognitive Function:        09/28/2022    3:43 PM 09/18/2021    3:09 PM  6CIT Screen  What Year? 0 points 0 points  What month? 0 points 0 points  What time? 0 points 0 points  Count back from 20 0 points 0 points  Months in reverse 0 points 0 points  Repeat phrase 0 points 0 points  Total Score 0 points 0 points    Immunizations Immunization History  Administered Date(s) Administered   Fluad Quad(high Dose 65+) 02/16/2022   Influenza, High Dose Seasonal PF 05/03/2021   Influenza-Unspecified 04/26/2021   PFIZER Comirnaty(Gray Top)Covid-19 Tri-Sucrose Vaccine 12/14/2020   PFIZER(Purple Top)SARS-COV-2 Vaccination 07/25/2019, 08/14/2019   PNEUMOCOCCAL CONJUGATE-20 08/18/2021   Pfizer Covid-19 Vaccine Bivalent Booster 23yrs & up 04/18/2021  Zoster Recombinat (Shingrix) 04/18/2021, 06/24/2021    TDAP status: Due, Education has been provided regarding the importance of this vaccine. Advised may receive this vaccine at local pharmacy or Health Dept. Aware to provide a copy of the vaccination record if obtained from local pharmacy or Health Dept. Verbalized acceptance and understanding.  Flu Vaccine status: Up to date  Pneumococcal vaccine status: Up to date  Covid-19 vaccine status:  Completed vaccines  Qualifies for Shingles Vaccine? Yes   Zostavax completed Yes   Shingrix Completed?: Yes  Screening Tests Health Maintenance  Topic Date Due   DTaP/Tdap/Td (1 - Tdap) Never done   COVID-19 Vaccine (5 - 2023-24 season) 01/15/2022   Hepatitis C Screening  10/17/2022 (Originally 04/06/1974)   INFLUENZA VACCINE  12/16/2022   Medicare Annual Wellness (AWV)  09/28/2023   COLONOSCOPY (Pts 45-56yrs Insurance coverage will need to be confirmed)  01/14/2030   Pneumonia Vaccine 78+ Years old  Completed   Zoster Vaccines- Shingrix  Completed   HPV VACCINES  Aged Out    Health Maintenance  Health Maintenance Due  Topic Date Due   DTaP/Tdap/Td (1 - Tdap) Never done   COVID-19 Vaccine (5 - 2023-24 season) 01/15/2022    Colorectal cancer screening: Type of screening: Colonoscopy. Completed 01/15/20. Repeat every 10 years   Additional Screening:  Hepatitis C Screening: does qualify  Vision Screening: Recommended annual ophthalmology exams for early detection of glaucoma and other disorders of the eye. Is the patient up to date with their annual eye exam?  Yes  Who is the provider or what is the name of the office in which the patient attends annual eye exams? [facility]  If pt is not established with a provider, would they like to be referred to a provider to establish care? No .   Dental Screening: Recommended annual dental exams for proper oral hygiene  Community Resource Referral / Chronic Care Management: CRR required this visit?  No   CCM required this visit?  No      Plan:     I have personally reviewed and noted the following in the patient's chart:   Medical and social history Use of alcohol, tobacco or illicit drugs  Current medications and supplements including opioid prescriptions. Patient is not currently taking opioid prescriptions. Functional ability and status Nutritional status Physical activity Advanced directives List of other  physicians Hospitalizations, surgeries, and ER visits in previous 12 months Vitals Screenings to include cognitive, depression, and falls Referrals and appointments  In addition, I have reviewed and discussed with patient certain preventive protocols, quality metrics, and best practice recommendations. A written personalized care plan for preventive services as well as general preventive health recommendations were provided to patient.     Marzella Schlein, LPN   1/61/0960   Nurse Notes: none

## 2022-09-28 NOTE — Patient Instructions (Signed)
Jackson [name] , Thank you for taking time to come for your Medicare Wellness Visit. I appreciate your ongoing commitment to your health goals. Please review the following plan we discussed and let me know if I can assist you in the future.   These are the goals we discussed:  Goals      Patient Stated     Pick up a little more exercise      Patient Stated     Exercise more         This is a list of the screening recommended for you and due dates:  Health Maintenance  Topic Date Due   DTaP/Tdap/Td vaccine (1 - Tdap) Never done   COVID-19 Vaccine (5 - 2023-24 season) 01/15/2022   Hepatitis C Screening: USPSTF Recommendation to screen - Ages 18-79 yo.  10/17/2022*   Flu Shot  12/16/2022   Medicare Annual Wellness Visit  09/28/2023   Colon Cancer Screening  01/14/2030   Pneumonia Vaccine  Completed   Zoster (Shingles) Vaccine  Completed   HPV Vaccine  Aged Out  *Topic was postponed. The date shown is not the original due date.    Advanced directives: Advance directive discussed with you today. Even though you declined this today please call our office should you change your mind and we can give you the proper paperwork for you to fill out.  Conditions/risks identified: exercise more   Next appointment: Follow up in one year for your annual wellness visit.   Preventive Care 1 Years and Older, Male  Preventive care refers to lifestyle choices and visits with your health care provider that can promote health and wellness. What does preventive care include? A yearly physical exam. This is also called an annual well check. Dental exams once or twice a year. Routine eye exams. Ask your health care provider how often you should have your eyes checked. Personal lifestyle choices, including: Daily care of your teeth and gums. Regular physical activity. Eating a healthy diet. Avoiding tobacco and drug use. Limiting alcohol use. Practicing safe sex. Taking low doses of aspirin every  day. Taking vitamin and mineral supplements as recommended by your health care provider. What happens during an annual well check? The services and screenings done by your health care provider during your annual well check will depend on your age, overall health, lifestyle risk factors, and family history of disease. Counseling  Your health care provider may ask you questions about your: Alcohol use. Tobacco use. Drug use. Emotional well-being. Home and relationship well-being. Sexual activity. Eating habits. History of falls. Memory and ability to understand (cognition). Work and work Astronomer. Screening  You may have the following tests or measurements: Height, weight, and BMI. Blood pressure. Lipid and cholesterol levels. These may be checked every 5 years, or more frequently if you are over 73 years old. Skin check. Lung cancer screening. You may have this screening every year starting at age 56 if you have a 30-pack-year history of smoking and currently smoke or have quit within the past 15 years. Fecal occult blood test (FOBT) of the stool. You may have this test every year starting at age 42. Flexible sigmoidoscopy or colonoscopy. You may have a sigmoidoscopy every 5 years or a colonoscopy every 10 years starting at age 52. Prostate cancer screening. Recommendations will vary depending on your family history and other risks. Hepatitis C blood test. Hepatitis B blood test. Sexually transmitted disease (STD) testing. Diabetes screening. This is done by checking your  blood sugar (glucose) after you have not eaten for a while (fasting). You may have this done every 1-3 years. Abdominal aortic aneurysm (AAA) screening. You may need this if you are a current or former smoker. Osteoporosis. You may be screened starting at age 55 if you are at high risk. Talk with your health care provider about your test results, treatment options, and if necessary, the need for more  tests. Vaccines  Your health care provider may recommend certain vaccines, such as: Influenza vaccine. This is recommended every year. Tetanus, diphtheria, and acellular pertussis (Tdap, Td) vaccine. You may need a Td booster every 10 years. Zoster vaccine. You may need this after age 71. Pneumococcal 13-valent conjugate (PCV13) vaccine. One dose is recommended after age 67. Pneumococcal polysaccharide (PPSV23) vaccine. One dose is recommended after age 49. Talk to your health care provider about which screenings and vaccines you need and how often you need them. This information is not intended to replace advice given to you by your health care provider. Make sure you discuss any questions you have with your health care provider. Document Released: 05/30/2015 Document Revised: 01/21/2016 Document Reviewed: 03/04/2015 Elsevier Interactive Patient Education  2017 [place] Prevention in the Home Falls can cause injuries. They can happen to people of all ages. There are many things you can do to make your home safe and to help prevent falls. What can I do on the outside of my home? Regularly fix the edges of walkways and driveways and fix any cracks. Remove anything that might make you trip as you walk through a door, such as a raised step or threshold. Trim any bushes or trees on the path to your home. Use bright outdoor lighting. Clear any walking paths of anything that might make someone trip, such as rocks or tools. Regularly check to see if handrails are loose or broken. Make sure that both sides of any steps have handrails. Any raised decks and porches should have guardrails on the edges. Have any leaves, snow, or ice cleared regularly. Use sand or salt on walking paths during winter. Clean up any spills in your garage right away. This includes oil or grease spills. What can I do in the bathroom? Use night lights. Install grab bars by the toilet and in the tub and shower.  Do not use towel bars as grab bars. Use non-skid mats or decals in the tub or shower. If you need to sit down in the shower, use a plastic, non-slip stool. Keep the floor dry. Clean up any water that spills on the floor as soon as it happens. Remove soap buildup in the tub or shower regularly. Attach bath mats securely with double-sided non-slip rug tape. Do not have throw rugs and other things on the floor that can make you trip. What can I do in the bedroom? Use night lights. Make sure that you have a light by your bed that is easy to reach. Do not use any sheets or blankets that are too big for your bed. They should not hang down onto the floor. Have a firm chair that has side arms. You can use this for support while you get dressed. Do not have throw rugs and other things on the floor that can make you trip. What can I do in the kitchen? Clean up any spills right away. Avoid walking on wet floors. Keep items that you use a lot in easy-to-reach places. If you need to reach something above  you, use a strong step stool that has a grab bar. Keep electrical cords out of the way. Do not use floor polish or wax that makes floors slippery. If you must use wax, use non-skid floor wax. Do not have throw rugs and other things on the floor that can make you trip. What can I do with my stairs? Do not leave any items on the stairs. Make sure that there are handrails on both sides of the stairs and use them. Fix handrails that are broken or loose. Make sure that handrails are as long as the stairways. Check any carpeting to make sure that it is firmly attached to the stairs. Fix any carpet that is loose or worn. Avoid having throw rugs at the top or bottom of the stairs. If you do have throw rugs, attach them to the floor with carpet tape. Make sure that you have a light switch at the top of the stairs and the bottom of the stairs. If you do not have them, ask someone to add them for you. What else  can I do to help prevent falls? Wear shoes that: Do not have high heels. Have rubber bottoms. Are comfortable and fit you well. Are closed at the toe. Do not wear sandals. If you use a stepladder: Make sure that it is fully opened. Do not climb a closed stepladder. Make sure that both sides of the stepladder are locked into place. Ask someone to hold it for you, if possible. Clearly mark and make sure that you can see: Any grab bars or handrails. First and last steps. Where the edge of each step is. Use tools that help you move around (mobility aids) if they are needed. These include: Canes. Walkers. Scooters. Crutches. Turn on the lights when you go into a dark area. Replace any light bulbs as soon as they burn out. Set up your furniture so you have a clear path. Avoid moving your furniture around. If any of your floors are uneven, fix them. If there are any pets around you, be aware of where they are. Review your medicines with your doctor. Some medicines can make you feel dizzy. This can increase your chance of falling. Ask your doctor what other things that you can do to help prevent falls. This information is not intended to replace advice given to you by your health care provider. Make sure you discuss any questions you have with your health care provider. Document Released: 02/27/2009 Document Revised: 10/09/2015 Document Reviewed: 06/07/2014 Elsevier Interactive Patient Education  2017 [employer].

## 2022-09-30 ENCOUNTER — Ambulatory Visit (INDEPENDENT_AMBULATORY_CARE_PROVIDER_SITE_OTHER): Payer: Medicare Other | Admitting: Family Medicine

## 2022-09-30 ENCOUNTER — Encounter: Payer: Self-pay | Admitting: Family Medicine

## 2022-09-30 VITALS — BP 129/75 | HR 58 | Temp 97.5°F | Ht 69.0 in | Wt 195.0 lb

## 2022-09-30 DIAGNOSIS — J029 Acute pharyngitis, unspecified: Secondary | ICD-10-CM

## 2022-09-30 DIAGNOSIS — E785 Hyperlipidemia, unspecified: Secondary | ICD-10-CM

## 2022-09-30 DIAGNOSIS — C61 Malignant neoplasm of prostate: Secondary | ICD-10-CM | POA: Diagnosis not present

## 2022-09-30 DIAGNOSIS — R7303 Prediabetes: Secondary | ICD-10-CM

## 2022-09-30 DIAGNOSIS — R059 Cough, unspecified: Secondary | ICD-10-CM

## 2022-09-30 LAB — POCT RAPID STREP A (OFFICE): Rapid Strep A Screen: NEGATIVE

## 2022-09-30 NOTE — Assessment & Plan Note (Addendum)
Was having some more issues with urinary frequency.  He saw urology a couple months ago.  They did give him a medication which he does not remove the name of this.  He did this for short period time however symptoms resolved with lifestyle modifications including avoidance of caffeine not currently taking any medications for urinary frequency.

## 2022-09-30 NOTE — Progress Notes (Addendum)
   Zashawn Conkey is a 67 y.o. male who presents today for an office visit.  Assessment/Plan:  New/Acute Problems: Cough / Sore Throat No red flags.  Reassuring exam today.  Rapid strep negative.  Likely had viral URI that is improving.  He will continue with conservative management.  We discussed reasons to return to care and seek emergent care.  Chronic Problems Addressed Today: Prostate cancer [facility]) s/p prostatectomy Was having some more issues with urinary frequency.  He saw urology a couple months ago.  They did give him a medication which he does not remove the name of this.  He did this for short period time however symptoms resolved with lifestyle modifications including avoidance of caffeine not currently taking any medications for urinary frequency.  Prediabetes Advised him to come back soon for CPE.  Will check A1c at that time.  Dyslipidemia Check lipids with labs at CPE. Continue Lipitor 40 mg daily.      Subjective:  HPI:  See Assessment / plan for status of chronic conditions. He is here today with cough and congestion. His grandsons were recently diagnosed with bronchitis and strep.  Symptoms started about a week ago. Tried OTC medications with some improvement. Still has some persistent cough. Getting a little better. Some fevers and chills last week but nothing recently. No chest pain or shortness of breath. He has had more fatigue. Cough is not productive.        Objective:  Physical Exam: BP 129/75   Pulse (!) 58   Temp (!) 97.5 F (36.4 C) (Temporal)   Ht 5\' 9"  (1.753 m)   Wt 195 lb (88.5 kg)   SpO2 97%   BMI 28.80 kg/m   Gen: No acute distress, resting comfortably HEENT: TMs clear.  OP slightly erythematous.  No exudates. CV: Regular rate and rhythm with no murmurs appreciated Pulm: Normal work of breathing, clear to auscultation bilaterally with no crackles, wheezes, or rhonchi Neuro: Grossly normal, moves all extremities Psych: Normal affect and thought  content      Shawntavia Saunders M. Jimmey Ralph, MD 09/30/2022 9:50 AM

## 2022-09-30 NOTE — Assessment & Plan Note (Addendum)
Check lipids with labs at CPE. Continue Lipitor 40 mg daily.

## 2022-09-30 NOTE — Assessment & Plan Note (Signed)
Advised him to come back soon for CPE.  Will check A1c at that time.

## 2022-09-30 NOTE — Patient Instructions (Signed)
It was very nice to see you today!  I think you had a upper respiratory infection (URI). This should continue to improve over the next several weeks.  Let us know if you have any worsening symptoms.  Please come back soon for your physical.  Return if symptoms worsen or fail to improve.   Take care, Dr Jimmey Ralph  PLEASE NOTE:  If you had any lab tests, please let us know if you have not heard back within a few days. You may see your results on mychart before we have a chance to review them but we will give you a call once they are reviewed by [place].   If we ordered any referrals today, please let us know if you have not heard from their office within the next week.   If you had any urgent prescriptions sent in today, please check with the pharmacy within an hour of our visit to make sure the prescription was transmitted appropriately.   Please try these tips to maintain a healthy lifestyle:  Eat at least 3 REAL meals and 1-2 snacks per day.  Aim for no more than 5 hours between eating.  If you eat breakfast, please do so within one hour of getting up.   Each meal should contain half fruits/vegetables, one quarter protein, and one quarter carbs (no bigger than a computer mouse)  Cut down on sweet beverages. This includes juice, soda, and sweet tea.   Drink at least 1 glass of water with each meal and aim for at least 8 glasses per day  Exercise at least 150 minutes every week.

## 2022-10-13 ENCOUNTER — Encounter: Payer: Self-pay | Admitting: Family Medicine

## 2022-10-13 ENCOUNTER — Ambulatory Visit (INDEPENDENT_AMBULATORY_CARE_PROVIDER_SITE_OTHER): Payer: Medicare Other | Admitting: Family Medicine

## 2022-10-13 VITALS — BP 111/68 | HR 52 | Temp 98.0°F | Ht 69.0 in | Wt 193.4 lb

## 2022-10-13 DIAGNOSIS — E785 Hyperlipidemia, unspecified: Secondary | ICD-10-CM | POA: Diagnosis not present

## 2022-10-13 DIAGNOSIS — C61 Malignant neoplasm of prostate: Secondary | ICD-10-CM | POA: Diagnosis not present

## 2022-10-13 DIAGNOSIS — R7303 Prediabetes: Secondary | ICD-10-CM | POA: Diagnosis not present

## 2022-10-13 LAB — CBC
HCT: 40.1 % (ref 39.0–52.0)
Hemoglobin: 12.8 g/dL — ABNORMAL LOW (ref 13.0–17.0)
MCHC: 31.8 g/dL (ref 30.0–36.0)
MCV: 85.2 fl (ref 78.0–100.0)
Platelets: 276 10*3/uL (ref 150.0–400.0)
RBC: 4.71 Mil/uL (ref 4.22–5.81)
RDW: 13.8 % (ref 11.5–15.5)
WBC: 6.4 10*3/uL (ref 4.0–10.5)

## 2022-10-13 LAB — COMPREHENSIVE METABOLIC PANEL
ALT: 10 U/L (ref 0–53)
AST: 13 U/L (ref 0–37)
Albumin: 4.1 g/dL (ref 3.5–5.2)
Alkaline Phosphatase: 98 U/L (ref 39–117)
BUN: 11 mg/dL (ref 6–23)
CO2: 27 mEq/L (ref 19–32)
Calcium: 9.5 mg/dL (ref 8.4–10.5)
Chloride: 108 mEq/L (ref 96–112)
Creatinine, Ser: 0.8 mg/dL (ref 0.40–1.50)
GFR: 91.57 mL/min (ref >60.00)
Glucose, Bld: 104 mg/dL — ABNORMAL HIGH (ref 70–99)
Potassium: 4.6 mEq/L (ref 3.5–5.1)
Sodium: 142 mEq/L (ref 135–145)
Total Bilirubin: 0.5 mg/dL (ref 0.2–1.2)
Total Protein: 7 g/dL (ref 6.0–8.3)

## 2022-10-13 LAB — PSA: PSA: 0.08 ng/mL — ABNORMAL LOW (ref 0.10–4.00)

## 2022-10-13 LAB — LIPID PANEL
Cholesterol: 109 mg/dL (ref 0–200)
HDL: 41 mg/dL (ref >39.00)
LDL Cholesterol: 40 mg/dL (ref 0–99)
NonHDL: 67.83
Total CHOL/HDL Ratio: 3
Triglycerides: 141 mg/dL (ref 0.0–149.0)
VLDL: 28.2 mg/dL (ref 0.0–40.0)

## 2022-10-13 LAB — HEMOGLOBIN A1C: Hgb A1c MFr Bld: 6.1 % (ref 4.6–6.5)

## 2022-10-13 LAB — TSH: TSH: 1.56 u[IU]/mL (ref 0.35–5.50)

## 2022-10-13 NOTE — Patient Instructions (Signed)
It was very nice to see you today!  We will check blood work today.  Please continue to work on diet and exercise.  Return in about 1 year (around 10/13/2023) for Annual Physical.   Take care, Dr Jimmey Ralph  PLEASE NOTE:  If you had any lab tests, please let us know if you have not heard back within a few days. You may see your results on mychart before we have a chance to review them but we will give you a call once they are reviewed by [place].   If we ordered any referrals today, please let us know if you have not heard from their office within the next week.   If you had any urgent prescriptions sent in today, please check with the pharmacy within an hour of our visit to make sure the prescription was transmitted appropriately.   Please try these tips to maintain a healthy lifestyle:  Eat at least 3 REAL meals and 1-2 snacks per day.  Aim for no more than 5 hours between eating.  If you eat breakfast, please do so within one hour of getting up.   Each meal should contain half fruits/vegetables, one quarter protein, and one quarter carbs (no bigger than a computer mouse)  Cut down on sweet beverages. This includes juice, soda, and sweet tea.   Drink at least 1 glass of water with each meal and aim for at least 8 glasses per day  Exercise at least 150 minutes every week.    Preventive Care 70 Years and Older, Male Preventive care refers to lifestyle choices and visits with your health care provider that can promote health and wellness. Preventive care visits are also called wellness exams. What can I expect for my preventive care visit? Counseling During your preventive care visit, your health care provider may ask about your: Medical history, including: Past medical problems. Family medical history. History of falls. Current health, including: Emotional well-being. Home life and relationship well-being. Sexual activity. Memory and ability to understand (cognition). Lifestyle,  including: Alcohol, nicotine or tobacco, and drug use. Access to firearms. Diet, exercise, and sleep habits. Work and work Astronomer. Sunscreen use. Safety issues such as seatbelt and bike helmet use. Physical exam Your health care provider will check your: Height and weight. These may be used to calculate your BMI (body mass index). BMI is a measurement that tells if you are at a healthy weight. Waist circumference. This measures the distance around your waistline. This measurement also tells if you are at a healthy weight and may help predict your risk of certain diseases, such as type 2 diabetes and high blood pressure. Heart rate and blood pressure. Body temperature. Skin for abnormal spots. What immunizations do I need?  Vaccines are usually given at various ages, according to a schedule. Your health care provider will recommend vaccines for you based on your age, medical history, and lifestyle or other factors, such as travel or where you work. What tests do I need? Screening Your health care provider may recommend screening tests for certain conditions. This may include: Lipid and cholesterol levels. Diabetes screening. This is done by checking your blood sugar (glucose) after you have not eaten for a while (fasting). Hepatitis C test. Hepatitis B test. HIV (human immunodeficiency virus) test. STI (sexually transmitted infection) testing, if you are at risk. Lung cancer screening. Colorectal cancer screening. Prostate cancer screening. Abdominal aortic aneurysm (AAA) screening. You may need this if you are a current or former smoker.  Talk with your health care provider about your test results, treatment options, and if necessary, the need for more tests. Follow these instructions at home: Eating and drinking  Eat a diet that includes fresh fruits and vegetables, whole grains, lean protein, and low-fat dairy products. Limit your intake of foods with high amounts of sugar,  saturated fats, and salt. Take vitamin and mineral supplements as recommended by your health care provider. Do not drink alcohol if your health care provider tells you not to drink. If you drink alcohol: Limit how much you have to 0-2 drinks a day. Know how much alcohol is in your drink. In the [place] one drink equals one 12 oz bottle of beer (355 mL), one 5 oz glass of wine (148 mL), or one 1 oz glass of hard liquor (44 mL). Lifestyle Brush your teeth every morning and night with fluoride toothpaste. Floss one time each day. Exercise for at least 30 minutes 5 or more days each week. Do not use any products that contain nicotine or tobacco. These products include cigarettes, chewing tobacco, and vaping devices, such as e-cigarettes. If you need help quitting, ask your health care provider. Do not use drugs. If you are sexually active, practice safe sex. Use a condom or other form of protection to prevent STIs. Take aspirin only as told by your health care provider. Make sure that you understand how much to take and what form to take. Work with your health care provider to find out whether it is safe and beneficial for you to take aspirin daily. Ask your health care provider if you need to take a cholesterol-lowering medicine (statin). Find healthy ways to manage stress, such as: Meditation, yoga, or listening to music. Journaling. Talking to a trusted person. Spending time with friends and family. Safety Always wear your seat belt while driving or riding in a vehicle. Do not drive: If you have been drinking alcohol. Do not ride with someone who has been drinking. When you are tired or distracted. While texting. If you have been using any mind-altering substances or drugs. Wear a helmet and other protective equipment during sports activities. If you have firearms in your house, make sure you follow all gun safety procedures. Minimize exposure to UV radiation to reduce your risk of skin  cancer. What's next? Visit your health care provider once a year for an annual wellness visit. Ask your health care provider how often you should have your eyes and teeth checked. Stay up to date on all vaccines. This information is not intended to replace advice given to you by your health care provider. Make sure you discuss any questions you have with your health care provider. Document Revised: 10/29/2020 Document Reviewed: 10/29/2020 Elsevier Patient Education  2024 [employer].

## 2022-10-13 NOTE — Assessment & Plan Note (Signed)
Following with urology.  Symptoms are well-controlled.  Was previously on Myrbetriq but no longer needs this.  He is not sure if they check PSA at his most recent visit with urology.  Will recheck today.

## 2022-10-13 NOTE — Assessment & Plan Note (Signed)
Check A1c.  Discussed lifestyle modifications. °

## 2022-10-13 NOTE — Assessment & Plan Note (Signed)
On Lipitor 40 mg daily.  Tolerating well.  Check lipids today.  We discussed lifestyle modifications.

## 2022-10-13 NOTE — Progress Notes (Signed)
Chief Complaint:  Jackson [name] is a 67 y.o. male who presents today for his annual comprehensive physical exam.    Assessment/Plan:  Chronic Problems Addressed Today: Dyslipidemia On Lipitor 40 mg daily.  Tolerating well.  Check lipids today.  We discussed lifestyle modifications.  Prostate cancer [facility]) s/p prostatectomy Following with urology.  Symptoms are well-controlled.  Was previously on Myrbetriq but no longer needs this.  He is not sure if they check PSA at his most recent visit with urology.  Will recheck today.  Prediabetes Check A1c.  Discussed lifestyle modifications.  Preventative Healthcare: Check labs.  Will get the tetanus vaccine at the pharmacy.  Up-to-date on other vaccines and screenings.  Patient Counseling(The following topics were reviewed and/or handout was given):  -Nutrition: Stressed importance of moderation in sodium/caffeine intake, saturated fat and cholesterol, caloric balance, sufficient intake of fresh fruits, vegetables, and fiber.  -Stressed the importance of regular exercise.   -Substance Abuse: Discussed cessation/primary prevention of tobacco, alcohol, or other drug use; driving or other dangerous activities under the influence; availability of treatment for abuse.   -Injury prevention: Discussed safety belts, safety helmets, smoke detector, smoking near bedding or upholstery.   -Sexuality: Discussed sexually transmitted diseases, partner selection, use of condoms, avoidance of unintended pregnancy and contraceptive alternatives.   -Dental health: Discussed importance of regular tooth brushing, flossing, and dental visits.  -Health maintenance and immunizations reviewed. Please refer to Health maintenance section.  Return to care in 1 year for next preventative visit.     Subjective:  HPI:  He has no acute complaints today.   Lifestyle Diet: Balanced.  Exercise: Very active at home with yard work. Walking routinely.      10/13/2022     8:20 AM  Depression screen PHQ 2/9  Decreased Interest 0  Down, Depressed, Hopeless 0  PHQ - 2 Score 0  Altered sleeping 0  Tired, decreased energy 0  Change in appetite 0  Feeling bad or failure about yourself  0  Trouble concentrating 0  Moving slowly or fidgety/restless 0  Suicidal thoughts 0  PHQ-9 Score 0  Difficult doing work/chores Not difficult at all    Health Maintenance Due  Topic Date Due   DTaP/Tdap/Td (1 - Tdap) Never done     ROS: Per HPI, otherwise a complete review of systems was negative.   PMH:  The following were reviewed and entered/updated in epic: Past Medical History:  Diagnosis Date   Anginal pain (HCC) 2019   heat stress. stress test was negative   Arthritis    Diverticulitis    Diverticulosis    HLD (hyperlipidemia)    Prostate cancer [facility])    Patient Active Problem List   Diagnosis Date Noted   B12 deficiency 01/26/2022   Normocytic anemia 01/26/2022   Overweight (BMI 25.0-29.9) 10/28/2021   Prediabetes 08/20/2021   Actinic keratosis 09/12/2020   Diverticular disease s/p robotic sigmoidectomy 2021 03/28/2020   Dyslipidemia 08/01/2019   Prostate cancer (HCC) s/p prostatectomy 08/01/2019   Osteoarthritis 08/01/2019   Past Surgical History:  Procedure Laterality Date   CHOLECYSTECTOMY  05/18/2007   CYSTOSCOPY WITH STENT PLACEMENT N/A 03/28/2020   Procedure: FIREFLY INJECTION;  Surgeon: Crist Fat, MD;  Location: [facility];  Service: Urology;  Laterality: N/A;   EYE SURGERY     PROSTATECTOMY  2011   REFRACTIVE SURGERY Bilateral    eyes   TONSILLECTOMY      Family History  Problem Relation Age of Onset  Lung cancer Mother    Depression Mother    Diabetes Brother    Alcohol abuse Brother    Depression Brother    Early death Father    Arthritis Father    Alcohol abuse Son    Cancer Maternal Grandfather        type unknown   Colon cancer Neg Hx    Esophageal cancer Neg Hx    Rectal cancer Neg Hx    Stomach cancer  Neg Hx     Medications- reviewed and updated Current Outpatient Medications  Medication Sig Dispense Refill   acetaminophen (TYLENOL) 500 MG tablet Take 1,000 mg by mouth every 6 (six) hours as needed.     atorvastatin (LIPITOR) 40 MG tablet TAKE 1 TABLET BY MOUTH EVERY DAY 90 tablet 0   Green Tea, Camellia sinensis, (GREEN TEA PO) Take 315 mg by mouth daily as needed (energy).     Multiple Vitamins-Minerals (ADULT GUMMY PO) Take 2 tablets by mouth daily.     vardenafil (LEVITRA) 20 MG tablet Take 0.5-1 tablets (10-20 mg total) by mouth daily as needed for erectile dysfunction. (Patient taking differently: Take 10 mg by mouth daily as needed for erectile dysfunction.) 10 tablet 0   No current facility-administered medications for this visit.    Allergies-reviewed and updated No Known Allergies  Social History   Socioeconomic History   Marital status: Married    Spouse name: Not on file   Number of children: 2   Years of education: Not on file   Highest education level: Associate degree: occupational, Scientist, product/process development, or vocational program  Occupational History   Occupation: retired  Tobacco Use   Smoking status: Never   Smokeless tobacco: Never  Vaping Use   Vaping Use: Never used  Substance and Sexual Activity   Alcohol use: Yes    Comment: beer every 6 months   Drug use: Never   Sexual activity: Not on file  Other Topics Concern   Not on file  Social History Narrative   Not on file   Social Determinants of Health   Financial Resource Strain: Low Risk  (09/27/2022)   Overall Financial Resource Strain (CARDIA)    Difficulty of Paying Living Expenses: Not hard at all  Food Insecurity: No Food Insecurity (09/27/2022)   Hunger Vital Sign    Worried About Running Out of Food in the Last Year: Never true    Ran Out of Food in the Last Year: Never true  Transportation Needs: No Transportation Needs (09/27/2022)   PRAPARE - Administrator, Civil Service (Medical): No     Lack of Transportation (Non-Medical): No  Physical Activity: Sufficiently Active (09/27/2022)   Exercise Vital Sign    Days of Exercise per Week: 4 days    Minutes of Exercise per Session: 50 min  Stress: No Stress Concern Present (09/27/2022)   [employer] of Occupational Health - Occupational Stress Questionnaire    Feeling of Stress : Not at all  Social Connections: Socially Isolated (09/27/2022)   Social Connection and Isolation Panel [NHANES]    Frequency of Communication with Friends and Family: Once a week    Frequency of Social Gatherings with Friends and Family: Once a week    Attends Religious Services: Never    Database administrator or Organizations: No    Attends Banker Meetings: Never    Marital Status: Married        Objective:  Physical Exam: BP 111/68  Pulse (!) 52   Temp 98 F (36.7 C) (Temporal)   Ht 5\' 9"  (1.753 m)   Wt 193 lb 6.4 oz (87.7 kg)   SpO2 98%   BMI 28.56 kg/m   Body mass index is 28.56 kg/m. Wt Readings from Last 3 Encounters:  10/13/22 193 lb 6.4 oz (87.7 kg)  09/30/22 195 lb (88.5 kg)  09/28/22 190 lb (86.2 kg)  Gen: NAD, resting comfortably HEENT: TMs normal bilaterally. OP clear. No thyromegaly noted.  CV: RRR with no murmurs appreciated Pulm: NWOB, CTAB with no crackles, wheezes, or rhonchi GI: Normal bowel sounds present. Soft, Nontender, Nondistended. MSK: no edema, cyanosis, or clubbing noted Skin: warm, dry Neuro: CN2-12 grossly intact. Strength 5/5 in upper and lower extremities. Reflexes symmetric and intact bilaterally.  Psych: Normal affect and thought content     Elley Harp M. Jimmey Ralph, MD 10/13/2022 8:58 AM

## 2022-10-15 ENCOUNTER — Other Ambulatory Visit: Payer: Self-pay | Admitting: *Deleted

## 2022-10-15 DIAGNOSIS — E785 Hyperlipidemia, unspecified: Secondary | ICD-10-CM

## 2022-10-15 MED ORDER — ATORVASTATIN CALCIUM 40 MG PO TABS: 40.0000 mg | ORAL_TABLET | Freq: Every day | ORAL | 1 refills | Status: DC

## 2022-10-15 NOTE — Progress Notes (Signed)
PSA 0.8.  This is consistent where he has been but he does need to follow-up with urology as we have discussed.  Cholesterol levels are all at goal.  His sugar is still in the prediabetic range but stable compared to previous.  Do not need to start meds but he should work on diet and exercise.  His blood counts are slightly low but also stable compared to his baseline.  His labs are all stable.  Do not need to make any other changes to treatment plan at this time.  We can recheck in a year.

## 2022-10-22 ENCOUNTER — Ambulatory Visit: Payer: Medicare Other | Admitting: Psychiatry

## 2022-10-22 DIAGNOSIS — Z630 Problems in relationship with spouse or partner: Secondary | ICD-10-CM

## 2022-10-22 DIAGNOSIS — F3340 Major depressive disorder, recurrent, in remission, unspecified: Secondary | ICD-10-CM

## 2022-10-22 DIAGNOSIS — Z87898 Personal history of other specified conditions: Secondary | ICD-10-CM

## 2022-10-22 DIAGNOSIS — F4322 Adjustment disorder with anxiety: Secondary | ICD-10-CM

## 2022-10-22 NOTE — Progress Notes (Deleted)
Psychotherapy Progress Note [facility] Marliss Czar, PhD LP  Patient ID: [name])    MRN: [id number] Therapy format: Individual psychotherapy Date: 10/22/2022      Start: 9:14a     Stop: ***:***     Time Spent: *** min Location: In-person   Session narrative (presenting needs, interim history, self-report of stressors and symptoms, applications of prior therapy, status changes, and interventions made in session) Traveling to [place]   Therapeutic modalities: {AM:23362::"Cognitive Behavioral Therapy","Solution-Oriented/Positive Psychology"}  Mental Status/Observations:  Appearance:   {PSY:22683}     Behavior:  {PSY:21022743}  Motor:  {PSY:22302}  Speech/Language:   {PSY:22685}  Affect:  {PSY:22687}  Mood:  {PSY:31886}  Thought process:  {PSY:31888}  Thought content:    {PSY:408 209 2928}  Sensory/Perceptual disturbances:    {[phone]}  Orientation:  {Psych Orientation:23301::"Fully oriented"}  Attention:  {Good-Fair-Poor ratings:23770::"Good"}    Concentration:  {Good-Fair-Poor ratings:23770::"Good"}  Memory:  {PSY:2520450666}  Insight:    {Good-Fair-Poor ratings:23770::"Good"}  Judgment:   {Good-Fair-Poor ratings:23770::"Good"}  Impulse Control:  {Good-Fair-Poor ratings:23770::"Good"}   Risk Assessment: Danger to Self: {Risk:22599::"No"} Self-injurious Behavior: {Risk:22599::"No"} Danger to Others: {Risk:22599::"No"} Physical Aggression / Violence: {Risk:22599::"No"} Duty to Warn: {AMYesNo:22526::"No"} Access to Firearms a concern: {AMYesNo:22526::"No"}  Assessment of progress:  {Progress:22147::"progressing"}  Diagnosis: No diagnosis found. Plan:  *** Other recommendations/advice -- As may be noted above.  Continue to utilize previously learned skills ad lib. Medication compliance -- Maintain medication as prescribed and work faithfully with relevant prescriber(s) if any changes are desired or seem indicated. Crisis  service -- Aware of call list and work-in appts.  Call the clinic on-call service, 988/hotline, 911, or present to [facility] or ER if any life-threatening psychiatric crisis. Followup -- No follow-ups on file.  Next scheduled visit with me 11/17/2022.  Next scheduled in this office 11/17/2022.  Robley Fries, PhD Marliss Czar, PhD LP Clinical Psychologist, [facility] [address] [place], [place] [address] [phone]
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
## 2022-10-28 ENCOUNTER — Encounter: Payer: Self-pay | Admitting: Family Medicine

## 2022-10-28 NOTE — Telephone Encounter (Signed)
Appreciate the update. We can recheck in a year.  Katina Degree. Jimmey Ralph, MD 10/28/2022 12:51 PM

## 2022-10-28 NOTE — Telephone Encounter (Signed)
See note

## 2022-11-17 ENCOUNTER — Ambulatory Visit: Payer: Medicare Other | Admitting: Psychiatry

## 2022-11-17 DIAGNOSIS — F4322 Adjustment disorder with anxiety: Secondary | ICD-10-CM | POA: Diagnosis not present

## 2022-11-17 DIAGNOSIS — Z87898 Personal history of other specified conditions: Secondary | ICD-10-CM

## 2022-11-17 DIAGNOSIS — Z630 Problems in relationship with spouse or partner: Secondary | ICD-10-CM | POA: Diagnosis not present

## 2022-11-17 DIAGNOSIS — F3340 Major depressive disorder, recurrent, in remission, unspecified: Secondary | ICD-10-CM | POA: Diagnosis not present
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
## 2022-11-22 ENCOUNTER — Ambulatory Visit: Payer: Medicare Other | Admitting: Psychiatry

## 2022-12-21 ENCOUNTER — Ambulatory Visit: Payer: Medicare Other | Admitting: Psychiatry

## 2022-12-21 DIAGNOSIS — Z630 Problems in relationship with spouse or partner: Secondary | ICD-10-CM

## 2022-12-21 DIAGNOSIS — Z87898 Personal history of other specified conditions: Secondary | ICD-10-CM

## 2022-12-21 DIAGNOSIS — F4323 Adjustment disorder with mixed anxiety and depressed mood: Secondary | ICD-10-CM | POA: Diagnosis not present

## 2022-12-21 DIAGNOSIS — F3340 Major depressive disorder, recurrent, in remission, unspecified: Secondary | ICD-10-CM | POA: Diagnosis not present
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
## 2023-01-21 ENCOUNTER — Ambulatory Visit (INDEPENDENT_AMBULATORY_CARE_PROVIDER_SITE_OTHER): Payer: Medicare Other | Admitting: Psychiatry

## 2023-01-21 DIAGNOSIS — Z630 Problems in relationship with spouse or partner: Secondary | ICD-10-CM

## 2023-01-21 DIAGNOSIS — F3340 Major depressive disorder, recurrent, in remission, unspecified: Secondary | ICD-10-CM

## 2023-01-21 DIAGNOSIS — F4323 Adjustment disorder with mixed anxiety and depressed mood: Secondary | ICD-10-CM | POA: Diagnosis not present

## 2023-01-21 DIAGNOSIS — Z87898 Personal history of other specified conditions: Secondary | ICD-10-CM | POA: Diagnosis not present
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
## 2023-02-21 ENCOUNTER — Ambulatory Visit: Payer: Medicare Other | Admitting: Psychiatry

## 2023-02-21 DIAGNOSIS — Z630 Problems in relationship with spouse or partner: Secondary | ICD-10-CM | POA: Diagnosis not present

## 2023-02-21 DIAGNOSIS — F4323 Adjustment disorder with mixed anxiety and depressed mood: Secondary | ICD-10-CM | POA: Diagnosis not present

## 2023-02-21 DIAGNOSIS — Z87898 Personal history of other specified conditions: Secondary | ICD-10-CM

## 2023-02-21 DIAGNOSIS — F3340 Major depressive disorder, recurrent, in remission, unspecified: Secondary | ICD-10-CM
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
## 2023-03-23 ENCOUNTER — Ambulatory Visit: Payer: Medicare Other | Admitting: Psychiatry

## 2023-03-25 ENCOUNTER — Encounter: Payer: Self-pay | Admitting: Family Medicine

## 2023-04-11 ENCOUNTER — Ambulatory Visit: Payer: Medicare Other | Admitting: Psychiatry

## 2023-04-11 DIAGNOSIS — F3340 Major depressive disorder, recurrent, in remission, unspecified: Secondary | ICD-10-CM

## 2023-04-11 DIAGNOSIS — F4323 Adjustment disorder with mixed anxiety and depressed mood: Secondary | ICD-10-CM | POA: Diagnosis not present

## 2023-04-11 DIAGNOSIS — Z630 Problems in relationship with spouse or partner: Secondary | ICD-10-CM | POA: Diagnosis not present

## 2023-04-11 DIAGNOSIS — Z87898 Personal history of other specified conditions: Secondary | ICD-10-CM | POA: Diagnosis not present
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
## 2023-04-20 ENCOUNTER — Ambulatory Visit: Payer: Medicare Other | Admitting: Psychiatry

## 2023-04-20 DIAGNOSIS — Z87898 Personal history of other specified conditions: Secondary | ICD-10-CM

## 2023-04-20 DIAGNOSIS — F3340 Major depressive disorder, recurrent, in remission, unspecified: Secondary | ICD-10-CM

## 2023-04-20 DIAGNOSIS — Z630 Problems in relationship with spouse or partner: Secondary | ICD-10-CM | POA: Diagnosis not present

## 2023-04-20 DIAGNOSIS — F4323 Adjustment disorder with mixed anxiety and depressed mood: Secondary | ICD-10-CM

## 2023-04-20 NOTE — Progress Notes (Unsigned)
Psychotherapy Progress Note [facility] Marliss Czar, PhD LP  Patient ID: Jackson [name] [facility])    MRN: [id number] Therapy format: Individual psychotherapy Date: 04/20/2023      Start: 9:20a     Stop: ***:***     Time Spent: *** min Location: In-person   Session narrative (presenting needs, interim history, self-report of stressors and symptoms, applications of prior therapy, status changes, and interventions made in session) Calmer last week, overall, but sad to see how Jackson [name] is not accepting Jackson [name]'s decline.  Barely coherent on pain meds now, he thinks it's time for Hospice.  Apparently Jackson [name] has the misconception that if she engages them too soon, she can't bring them back later.  And Jackson [name] does not necessarily believe she's terminal.  Concern they are warming up to blow through all their money chasing fantasy solutions.  Also knows they are secretly dealing with 2 weeks of vomiting, not consulting the oncologist.  Jackson [name] has decided she wants to be the one to bring it up, which is apprehensive, but basically able to let her try her hand first, as the mother.  Agreed if she does spark more conflict, it largely sets up Jackson [name] to be approachable to help Jackson [name] see reason.  Jackson [name] largely stopped drinking 3 days, in a welcome development, though she was also prone to manage him doing things, even tending furniture as asked by her own daughter, Jackson [name].  Resumed soon enough, started up again about getting a motor home, still to serve the unrealistic idea of taking the girls and the boys on a cross-country trip, now after Jackson [name] dies.  Agreed it only makes sense if Jackson [name] agrees, and the purchase can wait.  Since resuming drinking, she has returned to her ways of accusing him of interests and affairs online, labelking and haranguing about the motor home idea.  Seeing the dogs react more to emotionalized moments, running from her at moments.    Probed history of the turn in their  relationship, which is about 30 yrs now.  Tied up with changes in location and real estate, she escalated her drinking about 6-7 years ago while based in [place].  Alienated her older son Jackson [name] by then, for reasons nor specified, but figured to be her drunk talk.  Knows Jackson [name] is an incest survivor (father), claims to have dealt with it.    Affirmed continuing choice over whether to be in the relationship, and the practices of dropping the rope quickly when   Recommended finding moments to reply, "I could answer that -- do you really want to know?"  Also recommended finding a moment somehow to let Jackson [name] know, when it might be hearable,   Therapeutic modalities: {AM:23362::"Cognitive Behavioral Therapy","Solution-Oriented/Positive Psychology"}  Mental Status/Observations:  Appearance:   {PSY:22683}     Behavior:  {PSY:21022743}  Motor:  {PSY:22302}  Speech/Language:   {PSY:22685}  Affect:  {PSY:22687}  Mood:  {PSY:31886}  Thought process:  {PSY:31888}  Thought content:    {PSY:249-448-6885}  Sensory/Perceptual disturbances:    {PSY:432-448-6227}  Orientation:  {Psych Orientation:23301::"Fully oriented"}  Attention:  {Good-Fair-Poor ratings:23770::"Good"}    Concentration:  {Good-Fair-Poor ratings:23770::"Good"}  Memory:  {PSY:[id number]}  Insight:    {Good-Fair-Poor ratings:23770::"Good"}  Judgment:   {Good-Fair-Poor ratings:23770::"Good"}  Impulse Control:  {Good-Fair-Poor ratings:23770::"Good"}   Risk Assessment: Danger to Self: {Risk:22599::"No"} Self-injurious Behavior: {Risk:22599::"No"} Danger to Others: {Risk:22599::"No"} Physical Aggression / Violence: {Risk:22599::"No"} Duty to Warn: {AMYesNo:22526::"No"} Access to Firearms a concern: {AMYesNo:22526::"No"}  Assessment of progress:  {Progress:22147::"progressing"}  Diagnosis: No diagnosis  found. Plan:  *** Other recommendations/advice -- As may be noted above.  Continue to utilize previously learned skills ad lib. Medication  compliance -- Maintain medication as prescribed and work faithfully with relevant prescriber(s) if any changes are desired or seem indicated. Crisis service -- Aware of call list and work-in appts.  Call the clinic on-call service, 988/hotline, 911, or present to [facility] or ER if any life-threatening psychiatric crisis. Followup -- No follow-ups on file.  Next scheduled visit with me 05/23/2023.  Next scheduled in this office 05/23/2023.  Robley Fries, PhD Marliss Czar, PhD LP Clinical Psychologist, [facility] [address], [address] [place], [place] [address] [phone]

## 2023-05-23 ENCOUNTER — Ambulatory Visit: Payer: Medicare Other | Admitting: Psychiatry

## 2023-05-23 DIAGNOSIS — F3340 Major depressive disorder, recurrent, in remission, unspecified: Secondary | ICD-10-CM

## 2023-05-23 DIAGNOSIS — F4323 Adjustment disorder with mixed anxiety and depressed mood: Secondary | ICD-10-CM | POA: Diagnosis not present

## 2023-05-23 DIAGNOSIS — Z630 Problems in relationship with spouse or partner: Secondary | ICD-10-CM

## 2023-05-23 NOTE — Progress Notes (Signed)
 Psychotherapy Progress Note [facility] Jodie Kendall, PhD LP  Patient ID: Jackson [name])    MRN: [id number] Therapy format: Individual psychotherapy Date: 05/23/2023      Start: 9:19a     Stop: 10:06a     Time Spent: 47 min Location: In-person   Session narrative (presenting needs, interim history, self-report of stressors and symptoms, applications of prior therapy, status changes, and interventions made in session) A few days after last seen, Jackson [name] was admitted to the hospital, passed away Christmas Eve.  Jackson [name] relented and let the boys visit her.  Saw Jackson [name] rapidly catch up to accepting her as terminal, and he and Jackson [name] worked out occupying the boys while Jackson [name] was transported -- quite delicately, it turns out -- home to die.  Jackson [name] took her moments to criticize how he manages the boys, which he finds ironic in light of her endless bargaining, and allowing way too much mess, and and the heavy door he installed to replace a broken one.  Amusing episodes managing the boys' picky eating and Jackson [name]'s hostile reaction to him doing things differently from her, getting drunk and effectively reaming him out as not the boys' actual grandfather, ordered him to go away and spend Christmas with his family.  Made his own decisions to see Jackson [name] and Jackson [name], appropriately declined other fights.  Through Xmas week, Jackson [name] continued to drink while on duty with the boys, unbeknownst to Jackson [name], and the day of breaking it to the boys had to clear empty bottles quickly and endure a string of crabby, demanding comments about commuting the boys back to their house, ordering him to carry her things, and forbidding him from family gathering again.  When he came over Jackson [name], at [facility] invitation, Jackson [name] was ticked off enough to leave silently.  On the whole, clearly deteriorating control and conduct for Jackson [name], souring the holidays beyond the family loss, and still pushing the idea of an extended  RV trip, at the substantial cost of the RV, without agreement or buy in from Jackson [name].  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  As far as the marriage is concerned, found himself more seriously considering separation in the midst of being effectively forbidden from family, and had an offer from [place].  Knows of some options locally, if he wanted to do a trial separation but be available to the kids, the dogs, [place], and Jackson [name], if amenable.  Had to put his foot down on the motor home idea.  Affirmed grounding his opposition to it reality -- Jackson [name] needs to demonstrate sobriety first and confirm with her daughter that the grand trip idea is something she wants; meanwhile he does not want to be confined with Jackson [name] in a hostile state or at risk of unsafe conduct -- and staying sparing about references to himself and his way of doing things.  Did not find opportunity to tell Jackson [name] he misses her (or one version of her, anyway), as too many things became too galling, but still owns that it's a decent idea to recruit her good will, and hopefully to put a pause into her repetitious resentment and the idea that he is just a narcissist.  Affirmed all instincts to stick with what's realistic, do kindness more because it's who he means to be than counting on it to win over an addicted mind, and make sure he stays willing to promote a path to his cooperation on the motor home  idea, namely 6 months' sobriety and family unity about doing it.    Unsure, but believes that now she has made it past the initial shock of her wife dying, and now that she's seen more of her mother's behavior, Jackson [name] is archivist again to confront Jackson [name]'s addiction.  And that seems to be currently powering 3 days of sobriety.  Affirmed and encouraged.  Therapeutic modalities: Cognitive Behavioral Therapy, Solution-Oriented/Positive Psychology, Environmental Manager, and 12-Step  Mental  Status/Observations:  Appearance:   Casual     Behavior:  Appropriate  Motor:  Normal  Speech/Language:   Clear and Coherent  Affect:  Appropriate  Mood:  saddened  Thought process:  normal  Thought content:    WNL  Sensory/Perceptual disturbances:    WNL  Orientation:  Fully oriented  Attention:  Good    Concentration:  Good  Memory:  WNL  Insight:    Good  Judgment:   Good  Impulse Control:  Good   Risk Assessment: Danger to Self: No Self-injurious Behavior: No Danger to Others: No Physical Aggression / Violence: No Duty to Warn: No Access to Firearms a concern: No  Assessment of progress:  stabilized  Diagnosis:   ICD-10-CM   1. Major depressive disorder, recurrent, in remission (HCC)  F33.40     2. Adjustment disorder with mixed anxiety and depressed mood  F43.23     3. Relationship problem between partners  Z63.0      Plan:  Communication tactics for recruiting best chance of Jackson [name] hearing his concerns, backing off resentful diatribes.  Generally, hear her out, briefly, see if she can explain.  Make short work of defending anything, especially when it's just shotgun negativity he's responding to.  May try agreeing with an accusation, in principle (If that's what I'm doing and I was in your shoes, I'd feel the same way or something similar), and ask what she actually sees and needs at the moment, if only to force some exercise being direct, logical, and constructive.  Otherwise, certainly endorse taking unilateral timeout and getting physically out of reach if need be, best practice being to let her know why he's leaving and approximately when and how he'll return.  Definitely don't try to argue when she is intoxicated, just use his own presence and reactions as reward or time out for her behavior, and let words and actions match. 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  Referrals available when time is ripe.   If safety issue, be willing to call law enforcement to take Jackson [name] off the road.  Maintain standards about not enabling, i.e., not fetching alcohol for her and supporting Jackson [name]'s limits about child care. Al-Anon offered for further moral support.  If not, self-affirm he is generally pursuing healthy boundaries and doing a good job of being stoic and grounded where needed.  Other groups of interest also recommended, e.g., a writers' group. Maintain pleasant activities, including writing, caring for grandchildren, other pursuits.  Ideas for protecting time he wants to focus or take time out. Other recommendations/advice -- As may be noted above.  Continue to utilize previously learned skills ad lib. Medication compliance -- Maintain medication as prescribed and work faithfully with relevant prescriber(s) if any changes are desired or seem indicated. Crisis service -- Aware of call list and work-in appts.  Call the clinic on-call service, 988/hotline, 911, or present to [facility] or ER if any life-threatening psychiatric crisis. Followup -- No follow-ups on  file.  Next scheduled visit with me 06/22/2023.  Next scheduled in this office 06/22/2023.  Lamar Kendall, PhD Jodie Kendall, PhD LP Clinical Psychologist, [facility] [address] [place], [place] [address] [phone]

## 2023-06-22 ENCOUNTER — Ambulatory Visit: Payer: Medicare Other | Admitting: Psychiatry

## 2023-07-12 ENCOUNTER — Ambulatory Visit: Payer: Medicare Other | Admitting: Psychiatry

## 2023-07-12 DIAGNOSIS — F3340 Major depressive disorder, recurrent, in remission, unspecified: Secondary | ICD-10-CM

## 2023-07-12 DIAGNOSIS — F4323 Adjustment disorder with mixed anxiety and depressed mood: Secondary | ICD-10-CM

## 2023-07-12 DIAGNOSIS — Z630 Problems in relationship with spouse or partner: Secondary | ICD-10-CM
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
## 2023-07-20 ENCOUNTER — Ambulatory Visit: Payer: Medicare Other | Admitting: Psychiatry

## 2023-07-20 DIAGNOSIS — F4323 Adjustment disorder with mixed anxiety and depressed mood: Secondary | ICD-10-CM

## 2023-07-20 DIAGNOSIS — Z87898 Personal history of other specified conditions: Secondary | ICD-10-CM

## 2023-07-20 DIAGNOSIS — F3340 Major depressive disorder, recurrent, in remission, unspecified: Secondary | ICD-10-CM

## 2023-07-20 DIAGNOSIS — Z630 Problems in relationship with spouse or partner: Secondary | ICD-10-CM
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
## 2023-07-21 ENCOUNTER — Other Ambulatory Visit: Payer: Self-pay | Admitting: Family Medicine

## 2023-07-21 DIAGNOSIS — E785 Hyperlipidemia, unspecified: Secondary | ICD-10-CM

## 2023-08-17 ENCOUNTER — Ambulatory Visit (INDEPENDENT_AMBULATORY_CARE_PROVIDER_SITE_OTHER): Payer: Medicare Other | Admitting: Psychiatry

## 2023-08-17 DIAGNOSIS — F3340 Major depressive disorder, recurrent, in remission, unspecified: Secondary | ICD-10-CM | POA: Diagnosis not present

## 2023-08-17 DIAGNOSIS — F4323 Adjustment disorder with mixed anxiety and depressed mood: Secondary | ICD-10-CM | POA: Diagnosis not present

## 2023-08-17 DIAGNOSIS — Z87898 Personal history of other specified conditions: Secondary | ICD-10-CM

## 2023-08-17 DIAGNOSIS — Z630 Problems in relationship with spouse or partner: Secondary | ICD-10-CM | POA: Diagnosis not present
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
## 2023-09-19 ENCOUNTER — Ambulatory Visit: Payer: Medicare Other | Admitting: Psychiatry

## 2023-09-19 DIAGNOSIS — Z87898 Personal history of other specified conditions: Secondary | ICD-10-CM

## 2023-09-19 DIAGNOSIS — F4323 Adjustment disorder with mixed anxiety and depressed mood: Secondary | ICD-10-CM | POA: Diagnosis not present

## 2023-09-19 DIAGNOSIS — F3340 Major depressive disorder, recurrent, in remission, unspecified: Secondary | ICD-10-CM | POA: Diagnosis not present

## 2023-09-19 DIAGNOSIS — Z630 Problems in relationship with spouse or partner: Secondary | ICD-10-CM
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
## 2023-10-06 ENCOUNTER — Ambulatory Visit (INDEPENDENT_AMBULATORY_CARE_PROVIDER_SITE_OTHER): Payer: Medicare Other

## 2023-10-06 VITALS — Ht 70.0 in | Wt 175.0 lb

## 2023-10-06 DIAGNOSIS — Z0000 Encounter for general adult medical examination without abnormal findings: Secondary | ICD-10-CM

## 2023-10-06 NOTE — Patient Instructions (Addendum)
 Jackson [name] , Thank you for taking time out of your busy schedule to complete your Annual Wellness Visit with me. I enjoyed our conversation and look forward to speaking with you again next year. I, as well as your care team,  appreciate your ongoing commitment to your health goals. Please review the following plan we discussed and let me know if I can assist you in the future. Your Game plan/ To Do List    Referrals: If you haven't heard from the office you've been referred to, please reach out to them at the phone provided.   Follow up Visits: Next Medicare AWV with our clinical staff: 10/18/24   Have you seen your provider in the last 6 months (3 months if uncontrolled diabetes)? No Next Office Visit with your provider: Pt stated he will follow up to make appt   Tetnuas DTaP/TD  due Now   Clinician Recommendations:  Aim for 30 minutes of exercise or brisk walking, 6-8 glasses of water , and 5 servings of fruits and vegetables each day.       This is a list of the screening recommended for you and due dates:  Health Maintenance  Topic Date Due   Hepatitis C Screening  Never done   DTaP/Tdap/Td vaccine (1 - Tdap) Never done   COVID-19 Vaccine (7 - Pfizer risk 2024-25 season) 09/22/2023   Flu Shot  12/16/2023   Medicare Annual Wellness Visit  10/05/2024   Colon Cancer Screening  01/14/2030   Pneumonia Vaccine  Completed   Zoster (Shingles) Vaccine  Completed   HPV Vaccine  Aged Out   Meningitis B Vaccine  Aged Out    Advanced directives: (Declined) Advance directive discussed with you today. Even though you declined this today, please call our office should you change your mind, and we can give you the proper paperwork for you to fill out. Advance Care Planning is important because it:  [x]  Makes sure you receive the medical care that is consistent with your values, goals, and preferences  [x]  It provides guidance to your family and loved ones and reduces their decisional burden about  whether or not they are making the right decisions based on your wishes.  Follow the link provided in your after visit summary or read over the paperwork we have mailed to you to help you started getting your Advance Directives in place. If you need assistance in completing these, please reach out to us  so that we can help you!  See attachments for Preventive Care and Fall Prevention Tips.

## 2023-10-06 NOTE — Progress Notes (Signed)
 Subjective:   Jackson [name] is a 68 y.o. who presents for a Medicare Wellness preventive visit.  As a reminder, Annual Wellness Visits don't include a physical exam, and some assessments may be limited, especially if this visit is performed virtually. We may recommend an in-person follow-up visit with your provider if needed.  Visit Complete: Virtual I connected with  Jackson [name] on 10/06/23 by a audio enabled telemedicine application and verified that I am speaking with the correct person using two identifiers.  Patient Location: Home  Provider Location: Office/Clinic  I discussed the limitations of evaluation and management by telemedicine. The patient expressed understanding and agreed to proceed.  Vital Signs: Because this visit was a virtual/telehealth visit, some criteria may be missing or patient reported. Any vitals not documented were not able to be obtained and vitals that have been documented are patient reported.  VideoDeclined- This patient declined Librarian, academic. Therefore the visit was completed with audio only.  Persons Participating in Visit: Patient.  AWV Questionnaire: Yes: Patient Medicare AWV questionnaire was completed by the patient on 10/04/23; I have confirmed that all information answered by patient is correct and no changes since this date.  Cardiac Risk Factors include: advanced age (>75men, >33 women);dyslipidemia;male gender     Objective:     Today's Vitals   10/06/23 1404  Weight: 175 lb (79.4 kg)  Height: 5\' 10"  (1.778 m)   Body mass index is 25.11 kg/m.     10/06/2023    2:07 PM 09/28/2022    3:42 PM 10/27/2021   10:12 PM 10/13/2021    8:29 AM 09/18/2021    3:06 PM 10/01/2020    8:39 AM 03/28/2020    1:44 PM  Advanced Directives  Does Patient Have a Medical Advance Directive? No No No No No No No  Would patient like information on creating a medical advance directive? No - Patient declined No - Patient declined  No - Patient declined No - Patient declined No - Patient declined Yes (MAU/Ambulatory/Procedural Areas - Information given) No - Patient declined    Current Medications (verified) Outpatient Encounter Medications as of 10/06/2023  Medication Sig   acetaminophen  (TYLENOL ) 500 MG tablet Take 1,000 mg by mouth every 6 (six) hours as needed.   amoxicillin (AMOXIL) 500 MG tablet Take by mouth.   atorvastatin  (LIPITOR) 40 MG tablet TAKE 1 TABLET BY MOUTH EVERY DAY   Green Tea, Camellia sinensis, (GREEN TEA PO) Take 315 mg by mouth daily as needed (energy).   ibuprofen (ADVIL) 600 MG tablet Take 600 mg by mouth every 6 (six) hours as needed.   Multiple Vitamins-Minerals (ADULT GUMMY PO) Take 2 tablets by mouth daily.   vardenafil  (LEVITRA ) 20 MG tablet Take 0.5-1 tablets (10-20 mg total) by mouth daily as needed for erectile dysfunction. (Patient taking differently: Take 10 mg by mouth daily as needed for erectile dysfunction.)   No facility-administered encounter medications on file as of 10/06/2023.    Allergies (verified) Patient has no known allergies.   History: Past Medical History:  Diagnosis Date   Anginal pain (HCC) 2019   heat stress. stress test was negative   Arthritis    Diverticulitis    Diverticulosis    HLD (hyperlipidemia)    Prostate cancer [facility])    Past Surgical History:  Procedure Laterality Date   CHOLECYSTECTOMY  05/18/2007   CYSTOSCOPY WITH STENT PLACEMENT N/A 03/28/2020   Procedure: FIREFLY INJECTION;  Surgeon: Andrez Banker, MD;  Location:  [facility];  Service: Urology;  Laterality: N/A;   EYE SURGERY     PROSTATECTOMY  2011   REFRACTIVE SURGERY Bilateral    eyes   TONSILLECTOMY     Family History  Problem Relation Age of Onset   Lung cancer Mother    Depression Mother    Diabetes Brother    Alcohol abuse Brother    Depression Brother    Early death Father    Arthritis Father    Alcohol abuse Son    Cancer Maternal Grandfather        type unknown    Colon cancer Neg Hx    Esophageal cancer Neg Hx    Rectal cancer Neg Hx    Stomach cancer Neg Hx    Social History   Socioeconomic History   Marital status: Married    Spouse name: Not on file   Number of children: 2   Years of education: Not on file   Highest education level: Associate degree: occupational, Scientist, product/process development, or vocational program  Occupational History   Occupation: retired  Tobacco Use   Smoking status: Never   Smokeless tobacco: Never  Vaping Use   Vaping status: Never Used  Substance and Sexual Activity   Alcohol use: Not Currently    Comment: beer every 6 months   Drug use: Never   Sexual activity: Not on file  Other Topics Concern   Not on file  Social History Narrative   Not on file   Social Drivers of Health   Financial Resource Strain: Low Risk  (10/04/2023)   Overall Financial Resource Strain (CARDIA)    Difficulty of Paying Living Expenses: Not hard at all  Food Insecurity: No Food Insecurity (10/04/2023)   Hunger Vital Sign    Worried About Running Out of Food in the Last Year: Never true    Ran Out of Food in the Last Year: Never true  Transportation Needs: No Transportation Needs (10/04/2023)   PRAPARE - Administrator, Civil Service (Medical): No    Lack of Transportation (Non-Medical): No  Physical Activity: Sufficiently Active (10/04/2023)   Exercise Vital Sign    Days of Exercise per Week: 4 days    Minutes of Exercise per Session: 60 min  Stress: No Stress Concern Present (10/04/2023)   [employer] of Occupational Health - Occupational Stress Questionnaire    Feeling of Stress : Not at all  Social Connections: Moderately Isolated (10/04/2023)   Social Connection and Isolation Panel [NHANES]    Frequency of Communication with Friends and Family: Once a week    Frequency of Social Gatherings with Friends and Family: Once a week    Attends Religious Services: Never    Database administrator or Organizations: Yes    Attends  Engineer, structural: More than 4 times per year    Marital Status: Married    Tobacco Counseling Counseling given: Not Answered    Clinical Intake:  Pre-visit preparation completed: Yes  Pain : No/denies pain     BMI - recorded: 25.11 Nutritional Status: BMI 25 -29 Overweight Nutritional Risks: None Diabetes: No  Lab Results  Component Value Date   HGBA1C 6.1 10/13/2022   HGBA1C 6.0 08/18/2021     How often do you need to have someone help you when you read instructions, pamphlets, or other written materials from your doctor or pharmacy?: 1 - Never  Interpreter Needed?: No  Information entered by :: Lamont Pilsner, LPN  Activities of Daily Living     10/06/2023    2:06 PM  In your present state of health, do you have any difficulty performing the following activities:  Hearing? 0  Vision? 0  Difficulty concentrating or making decisions? 0  Walking or climbing stairs? 0  Dressing or bathing? 0  Doing errands, shopping? 0  Preparing Food and eating ? N  Using the Toilet? N  In the past six months, have you accidently leaked urine? N  Do you have problems with loss of bowel control? N  Managing your Medications? N  Managing your Finances? N  Housekeeping or managing your Housekeeping? N    Patient Care Team: Rodney Clamp, MD as PCP - General (Family Medicine)  Indicate any recent Medical Services you may have received from other than [facility] providers in the past year (date may be approximate).     Assessment:    This is a routine wellness examination for Jackson [name].  Hearing/Vision screen Hearing Screening - Comments:: Pt denies any hearing issues  Vision Screening - Comments:: Wears rx glasses - up to date with routine eye exams with [facility]     Goals Addressed             This Visit's Progress    Patient Stated       Maintain health and activity        Depression Screen     10/06/2023    2:08 PM 10/13/2022    8:20 AM  10/13/2022    8:18 AM 09/30/2022    8:44 AM 09/28/2022    3:40 PM 07/06/2022    8:45 AM 01/26/2022    8:44 AM  PHQ 2/9 Scores  PHQ - 2 Score 0 0 0 0 0 0 0  PHQ- 9 Score  0    0     Fall Risk     10/06/2023    2:09 PM 10/13/2022    8:18 AM 09/30/2022    8:44 AM 09/27/2022    6:11 PM 07/06/2022    8:45 AM  Fall Risk   Falls in the past year? 1 0 0 0 0  Number falls in past yr: 1 0 0 0 0  Injury with Fall? 1 0 0 0 0  Comment bruised left side of thigh      Risk for fall due to : History of fall(s) No Fall Risks No Fall Risks Impaired vision No Fall Risks  Follow up Falls prevention discussed   Falls prevention discussed     MEDICARE RISK AT HOME:  Medicare Risk at Home Any stairs in or around the home?: Yes If so, are there any without handrails?: No Home free of loose throw rugs in walkways, pet beds, electrical cords, etc?: Yes Adequate lighting in your home to reduce risk of falls?: Yes Life alert?: No Use of a cane, walker or w/c?: No Grab bars in the bathroom?: Yes Shower chair or bench in shower?: Yes Elevated toilet seat or a handicapped toilet?: No  TIMED UP AND GO:  Was the test performed?  No  Cognitive Function: 6CIT completed        10/06/2023    2:10 PM 09/28/2022    3:43 PM 09/18/2021    3:09 PM  6CIT Screen  What Year? 0 points 0 points 0 points  What month? 0 points 0 points 0 points  What time? 0 points 0 points 0 points  Count back from 20 0 points 0 points  0 points  Months in reverse 0 points 0 points 0 points  Repeat phrase 0 points 0 points 0 points  Total Score 0 points 0 points 0 points    Immunizations Immunization History  Administered Date(s) Administered   Fluad Quad(high Dose 65+) 02/16/2022   Influenza, High Dose Seasonal PF 05/03/2021   Influenza-Unspecified 04/26/2021, 03/25/2023   PFIZER Comirnaty(Gray Top)Covid-19 Tri-Sucrose Vaccine 12/14/2020   PFIZER(Purple Top)SARS-COV-2 Vaccination 07/25/2019, 08/14/2019, 03/04/2020    PNEUMOCOCCAL CONJUGATE-20 08/18/2021   Pfizer Covid-19 Vaccine Bivalent Booster 52yrs & up 04/18/2021   Unspecified SARS-COV-2 Vaccination 03/25/2023   Zoster Recombinant(Shingrix) 04/18/2021, 06/24/2021    Screening Tests Health Maintenance  Topic Date Due   Hepatitis C Screening  Never done   DTaP/Tdap/Td (1 - Tdap) Never done   COVID-19 Vaccine (7 - Pfizer risk 2024-25 season) 09/22/2023   INFLUENZA VACCINE  12/16/2023   Medicare Annual Wellness (AWV)  10/05/2024   Colonoscopy  01/14/2030   Pneumonia Vaccine 4+ Years old  Completed   Zoster Vaccines- Shingrix  Completed   HPV VACCINES  Aged Out   Meningococcal B Vaccine  Aged Out    Health Maintenance  Health Maintenance Due  Topic Date Due   Hepatitis C Screening  Never done   DTaP/Tdap/Td (1 - Tdap) Never done   COVID-19 Vaccine (7 - Pfizer risk 2024-25 season) 09/22/2023   Health Maintenance Items Addressed: See Nurse Notes  Additional Screening:  Vision Screening: Recommended annual ophthalmology exams for early detection of glaucoma and other disorders of the eye.  Dental Screening: Recommended annual dental exams for proper oral hygiene  Community Resource Referral / Chronic Care Management: CRR required this visit?  No   CCM required this visit?  No   Plan:    I have personally reviewed and noted the following in the patient's chart:   Medical and social history Use of alcohol, tobacco or illicit drugs  Current medications and supplements including opioid prescriptions. Patient is not currently taking opioid prescriptions. Functional ability and status Nutritional status Physical activity Advanced directives List of other physicians Hospitalizations, surgeries, and ER visits in previous 12 months Vitals Screenings to include cognitive, depression, and falls Referrals and appointments  In addition, I have reviewed and discussed with patient certain preventive protocols, quality metrics, and best  practice recommendations. A written personalized care plan for preventive services as well as general preventive health recommendations were provided to patient.   Bruno Capri, LPN   06/30/863   After Visit Summary: (MyChart) Due to this being a telephonic visit, the after visit summary with patients personalized plan was offered to patient via MyChart   Notes: Nothing significant to report at this time.

## 2023-10-18 ENCOUNTER — Ambulatory Visit: Payer: Medicare Other | Admitting: Psychiatry

## 2023-10-18 DIAGNOSIS — Z87898 Personal history of other specified conditions: Secondary | ICD-10-CM

## 2023-10-18 DIAGNOSIS — F3340 Major depressive disorder, recurrent, in remission, unspecified: Secondary | ICD-10-CM | POA: Diagnosis not present

## 2023-10-18 DIAGNOSIS — Z630 Problems in relationship with spouse or partner: Secondary | ICD-10-CM

## 2023-10-18 DIAGNOSIS — F4323 Adjustment disorder with mixed anxiety and depressed mood: Secondary | ICD-10-CM
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
## 2023-11-23 ENCOUNTER — Ambulatory Visit: Admitting: Psychiatry

## 2023-11-23 DIAGNOSIS — Z87898 Personal history of other specified conditions: Secondary | ICD-10-CM | POA: Diagnosis not present

## 2023-11-23 DIAGNOSIS — F3340 Major depressive disorder, recurrent, in remission, unspecified: Secondary | ICD-10-CM | POA: Diagnosis not present

## 2023-11-23 DIAGNOSIS — Z630 Problems in relationship with spouse or partner: Secondary | ICD-10-CM

## 2023-11-23 DIAGNOSIS — F4323 Adjustment disorder with mixed anxiety and depressed mood: Secondary | ICD-10-CM | POA: Diagnosis not present
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
## 2023-12-05 ENCOUNTER — Ambulatory Visit: Admitting: Family Medicine

## 2023-12-05 ENCOUNTER — Ambulatory Visit

## 2023-12-05 ENCOUNTER — Encounter: Payer: Self-pay | Admitting: Family Medicine

## 2023-12-05 VITALS — BP 124/67 | HR 56 | Temp 98.4°F | Ht 70.0 in | Wt 171.8 lb

## 2023-12-05 DIAGNOSIS — M542 Cervicalgia: Secondary | ICD-10-CM

## 2023-12-05 DIAGNOSIS — E538 Deficiency of other specified B group vitamins: Secondary | ICD-10-CM

## 2023-12-05 DIAGNOSIS — E785 Hyperlipidemia, unspecified: Secondary | ICD-10-CM

## 2023-12-05 DIAGNOSIS — C61 Malignant neoplasm of prostate: Secondary | ICD-10-CM | POA: Diagnosis not present

## 2023-12-05 DIAGNOSIS — R7303 Prediabetes: Secondary | ICD-10-CM

## 2023-12-05 DIAGNOSIS — L570 Actinic keratosis: Secondary | ICD-10-CM

## 2023-12-05 DIAGNOSIS — D649 Anemia, unspecified: Secondary | ICD-10-CM | POA: Diagnosis not present

## 2023-12-05 DIAGNOSIS — Z1159 Encounter for screening for other viral diseases: Secondary | ICD-10-CM

## 2023-12-05 DIAGNOSIS — Z0001 Encounter for general adult medical examination with abnormal findings: Secondary | ICD-10-CM | POA: Diagnosis not present

## 2023-12-05 LAB — COMPREHENSIVE METABOLIC PANEL WITH GFR
ALT: 13 U/L (ref 0–53)
AST: 14 U/L (ref 0–37)
Albumin: 4.6 g/dL (ref 3.5–5.2)
Alkaline Phosphatase: 91 U/L (ref 39–117)
BUN: 20 mg/dL (ref 6–23)
CO2: 27 meq/L (ref 19–32)
Calcium: 9.6 mg/dL (ref 8.4–10.5)
Chloride: 105 meq/L (ref 96–112)
Creatinine, Ser: 0.81 mg/dL (ref 0.40–1.50)
GFR: 90.5 mL/min (ref >60.00)
Glucose, Bld: 104 mg/dL — ABNORMAL HIGH (ref 70–99)
Potassium: 4.6 meq/L (ref 3.5–5.1)
Sodium: 139 meq/L (ref 135–145)
Total Bilirubin: 0.4 mg/dL (ref 0.2–1.2)
Total Protein: 7.3 g/dL (ref 6.0–8.3)

## 2023-12-05 LAB — CBC
HCT: 39 % (ref 39.0–52.0)
Hemoglobin: 12.5 g/dL — ABNORMAL LOW (ref 13.0–17.0)
MCHC: 32 g/dL (ref 30.0–36.0)
MCV: 84.3 fl (ref 78.0–100.0)
Platelets: 224 K/uL (ref 150.0–400.0)
RBC: 4.63 Mil/uL (ref 4.22–5.81)
RDW: 13.4 % (ref 11.5–15.5)
WBC: 7.4 K/uL (ref 4.0–10.5)

## 2023-12-05 LAB — LIPID PANEL
Cholesterol: 111 mg/dL (ref 0–200)
HDL: 46.6 mg/dL (ref >39.00)
LDL Cholesterol: 40 mg/dL (ref 0–99)
NonHDL: 64.19
Total CHOL/HDL Ratio: 2
Triglycerides: 121 mg/dL (ref 0.0–149.0)
VLDL: 24.2 mg/dL (ref 0.0–40.0)

## 2023-12-05 LAB — TSH: TSH: 0.98 u[IU]/mL (ref 0.35–5.50)

## 2023-12-05 LAB — VITAMIN B12: Vitamin B-12: 892 pg/mL (ref 211–911)

## 2023-12-05 LAB — HEMOGLOBIN A1C: Hgb A1c MFr Bld: 6.6 % — ABNORMAL HIGH (ref 4.6–6.5)

## 2023-12-05 NOTE — Assessment & Plan Note (Signed)
 Check A1c.  Discussed lifestyle modifications.

## 2023-12-05 NOTE — Assessment & Plan Note (Signed)
 Lesion on forehead consistent with actinic keratosis.  We did discuss cryotherapy versus referral to dermatology.  He like to be referred to see dermatologist.  Will place referral today.

## 2023-12-05 NOTE — Assessment & Plan Note (Signed)
 Check B12

## 2023-12-05 NOTE — Assessment & Plan Note (Signed)
 Check lipids.  He is doing well with Lipitor 40 mg daily.

## 2023-12-05 NOTE — Assessment & Plan Note (Signed)
 Check CBC

## 2023-12-05 NOTE — Patient Instructions (Signed)
 It was very nice to see you today!  We will check blood work today.  I will refer you to see the dermatologist and we will check an x-ray of your neck today.  Please continue to work on diet and exercise.  I will see back in a year for your next physical.  Come back sooner if needed.  Return in about 1 year (around 12/04/2024) for Annual Physical.   Take care, Dr Kennyth  PLEASE NOTE:  If you had any lab tests, please let us  know if you have not heard back within a few days. You may see your results on mychart before we have a chance to review them but we will give you a call once they are reviewed by us .   If we ordered any referrals today, please let us  know if you have not heard from their office within the next week.   If you had any urgent prescriptions sent in today, please check with the pharmacy within an hour of our visit to make sure the prescription was transmitted appropriately.   Please try these tips to maintain a healthy lifestyle:  Eat at least 3 REAL meals and 1-2 snacks per day.  Aim for no more than 5 hours between eating.  If you eat breakfast, please do so within one hour of getting up.   Each meal should contain half fruits/vegetables, one quarter protein, and one quarter carbs (no bigger than a computer mouse)  Cut down on sweet beverages. This includes juice, soda, and sweet tea.   Drink at least 1 glass of water  with each meal and aim for at least 8 glasses per day  Exercise at least 150 minutes every week.    Preventive Care 30 Years and Older, Male Preventive care refers to lifestyle choices and visits with your health care provider that can promote health and wellness. Preventive care visits are also called wellness exams. What can I expect for my preventive care visit? Counseling During your preventive care visit, your health care provider may ask about your: Medical history, including: Past medical problems. Family medical history. History of  falls. Current health, including: Emotional well-being. Home life and relationship well-being. Sexual activity. Memory and ability to understand (cognition). Lifestyle, including: Alcohol, nicotine or tobacco, and drug use. Access to firearms. Diet, exercise, and sleep habits. Work and work Astronomer. Sunscreen use. Safety issues such as seatbelt and bike helmet use. Physical exam Your health care provider will check your: Height and weight. These may be used to calculate your BMI (body mass index). BMI is a measurement that tells if you are at a healthy weight. Waist circumference. This measures the distance around your waistline. This measurement also tells if you are at a healthy weight and may help predict your risk of certain diseases, such as type 2 diabetes and high blood pressure. Heart rate and blood pressure. Body temperature. Skin for abnormal spots. What immunizations do I need?  Vaccines are usually given at various ages, according to a schedule. Your health care provider will recommend vaccines for you based on your age, medical history, and lifestyle or other factors, such as travel or where you work. What tests do I need? Screening Your health care provider may recommend screening tests for certain conditions. This may include: Lipid and cholesterol levels. Diabetes screening. This is done by checking your blood sugar (glucose) after you have not eaten for a while (fasting). Hepatitis C test. Hepatitis B test. HIV (human immunodeficiency virus)  test. STI (sexually transmitted infection) testing, if you are at risk. Lung cancer screening. Colorectal cancer screening. Prostate cancer screening. Abdominal aortic aneurysm (AAA) screening. You may need this if you are a current or former smoker. Talk with your health care provider about your test results, treatment options, and if necessary, the need for more tests. Follow these instructions at home: Eating and  drinking  Eat a diet that includes fresh fruits and vegetables, whole grains, lean protein, and low-fat dairy products. Limit your intake of foods with high amounts of sugar, saturated fats, and salt. Take vitamin and mineral supplements as recommended by your health care provider. Do not drink alcohol if your health care provider tells you not to drink. If you drink alcohol: Limit how much you have to 0-2 drinks a day. Know how much alcohol is in your drink. In the [place] one drink equals one 12 oz bottle of beer (355 mL), one 5 oz glass of wine (148 mL), or one 1 oz glass of hard liquor (44 mL). Lifestyle Brush your teeth every morning and night with fluoride toothpaste. Floss one time each day. Exercise for at least 30 minutes 5 or more days each week. Do not use any products that contain nicotine or tobacco. These products include cigarettes, chewing tobacco, and vaping devices, such as e-cigarettes. If you need help quitting, ask your health care provider. Do not use drugs. If you are sexually active, practice safe sex. Use a condom or other form of protection to prevent STIs. Take aspirin only as told by your health care provider. Make sure that you understand how much to take and what form to take. Work with your health care provider to find out whether it is safe and beneficial for you to take aspirin daily. Ask your health care provider if you need to take a cholesterol-lowering medicine (statin). Find healthy ways to manage stress, such as: Meditation, yoga, or listening to music. Journaling. Talking to a trusted person. Spending time with friends and family. Safety Always wear your seat belt while driving or riding in a vehicle. Do not drive: If you have been drinking alcohol. Do not ride with someone who has been drinking. When you are tired or distracted. While texting. If you have been using any mind-altering substances or drugs. Wear a helmet and other protective equipment  during sports activities. If you have firearms in your house, make sure you follow all gun safety procedures. Minimize exposure to UV radiation to reduce your risk of skin cancer. What's next? Visit your health care provider once a year for an annual wellness visit. Ask your health care provider how often you should have your eyes and teeth checked. Stay up to date on all vaccines. This information is not intended to replace advice given to you by your health care provider. Make sure you discuss any questions you have with your health care provider. Document Revised: 10/29/2020 Document Reviewed: 10/29/2020 Elsevier Patient Education  2024 [employer].

## 2023-12-05 NOTE — Progress Notes (Signed)
 Chief Complaint:  Jackson [name] is a 68 y.o. male who presents today for his annual comprehensive physical exam.    Assessment/Plan:  New/Acute Problems: Neck Pain  No red flags.  Likely due to osteoarthritis.  Reassuring exam today.  Will check plain film to further evaluate.  Depending on results may consider referral to PT or sports medicine.  Chronic Problems Addressed Today: Dyslipidemia Check lipids.  He is doing well with Lipitor 40 mg daily.  Prostate cancer [facility]) s/p prostatectomy Continue management per urology.  Actinic keratosis Lesion on forehead consistent with actinic keratosis.  We did discuss cryotherapy versus referral to dermatology.  He like to be referred to see dermatologist.  Will place referral today.  Prediabetes Check A1c.  Discussed lifestyle modifications.  B12 deficiency Check B12.  Normocytic anemia Check CBC.  Preventative Healthcare: Check labs.  Due for tetanus vaccine that she can get at this pharmacy.  Up-to-date on other vaccines and screenings.  Patient Counseling(The following topics were reviewed and/or handout was given):  -Nutrition: Stressed importance of moderation in sodium/caffeine intake, saturated fat and cholesterol, caloric balance, sufficient intake of fresh fruits, vegetables, and fiber.  -Stressed the importance of regular exercise.   -Substance Abuse: Discussed cessation/primary prevention of tobacco, alcohol, or other drug use; driving or other dangerous activities under the influence; availability of treatment for abuse.   -Injury prevention: Discussed safety belts, safety helmets, smoke detector, smoking near bedding or upholstery.   -Sexuality: Discussed sexually transmitted diseases, partner selection, use of condoms, avoidance of unintended pregnancy and contraceptive alternatives.   -Dental health: Discussed importance of regular tooth brushing, flossing, and dental visits.  -Health maintenance and immunizations  reviewed. Please refer to Health maintenance section.  Return to care in 1 year for next preventative visit.     Subjective:  HPI:  Patient here today for annual physical. He has a few additional concerns that he would like to discuss today.  He has noticed red irritated area on his forehead.  This is been present for about a year or so.  Tried using over-the-counter creams and lotions without much improvement.  Over the last several months he is also noted pain in his neck when turning and moving his head.  Predominately located in the back of his neck.  No obvious injuries or precipitating events.  No specific treatments tried for this.  No reported weakness or numbness.  Lifestyle Diet: Balanced. Trying to get plenty of fruits and vegetables.  Exercise: None specific but very busy around the house.      12/05/2023    9:17 AM  Depression screen PHQ 2/9  Decreased Interest 0  Down, Depressed, Hopeless 0  PHQ - 2 Score 0  Altered sleeping 0  Tired, decreased energy 0  Change in appetite 0  Feeling bad or failure about yourself  0  Trouble concentrating 0  Moving slowly or fidgety/restless 0  Suicidal thoughts 0  PHQ-9 Score 0  Difficult doing work/chores Not difficult at all    Health Maintenance Due  Topic Date Due   Hepatitis C Screening  Never done   DTaP/Tdap/Td (1 - Tdap) Never done     ROS: Per HPI, otherwise a complete review of systems was negative.   PMH:  The following were reviewed and entered/updated in epic: Past Medical History:  Diagnosis Date   Anginal pain (HCC) 2019   heat stress. stress test was negative   Arthritis    Diverticulitis    Diverticulosis  HLD (hyperlipidemia)    Prostate cancer [facility])    Patient Active Problem List   Diagnosis Date Noted   B12 deficiency 01/26/2022   Normocytic anemia 01/26/2022   Overweight (BMI 25.0-29.9) 10/28/2021   Prediabetes 08/20/2021   Actinic keratosis 09/12/2020   Diverticular disease s/p  robotic sigmoidectomy 2021 03/28/2020   Dyslipidemia 08/01/2019   Prostate cancer (HCC) s/p prostatectomy 08/01/2019   Osteoarthritis 08/01/2019   Past Surgical History:  Procedure Laterality Date   CHOLECYSTECTOMY  05/18/2007   CYSTOSCOPY WITH STENT PLACEMENT N/A 03/28/2020   Procedure: FIREFLY INJECTION;  Surgeon: Cam Morene ORN, MD;  Location: [facility];  Service: Urology;  Laterality: N/A;   EYE SURGERY     PROSTATECTOMY  2011   REFRACTIVE SURGERY Bilateral    eyes   TONSILLECTOMY      Family History  Problem Relation Age of Onset   Lung cancer Mother    Depression Mother    Diabetes Brother    Alcohol abuse Brother    Depression Brother    Early death Father    Arthritis Father    Alcohol abuse Son    Cancer Maternal Grandfather        type unknown   Colon cancer Neg Hx    Esophageal cancer Neg Hx    Rectal cancer Neg Hx    Stomach cancer Neg Hx     Medications- reviewed and updated Current Outpatient Medications  Medication Sig Dispense Refill   acetaminophen  (TYLENOL ) 500 MG tablet Take 1,000 mg by mouth every 6 (six) hours as needed.     atorvastatin  (LIPITOR) 40 MG tablet TAKE 1 TABLET BY MOUTH EVERY DAY 90 tablet 1   Green Tea, Camellia sinensis, (GREEN TEA PO) Take 315 mg by mouth daily as needed (energy).     ibuprofen (ADVIL) 600 MG tablet Take 600 mg by mouth every 6 (six) hours as needed.     Multiple Vitamins-Minerals (ADULT GUMMY PO) Take 2 tablets by mouth daily.     vardenafil  (LEVITRA ) 20 MG tablet Take 0.5-1 tablets (10-20 mg total) by mouth daily as needed for erectile dysfunction. (Patient taking differently: Take 10 mg by mouth daily as needed for erectile dysfunction.) 10 tablet 0   No current facility-administered medications for this visit.    Allergies-reviewed and updated No Known Allergies  Social History   Socioeconomic History   Marital status: Married    Spouse name: Not on file   Number of children: 2   Years of education:  Not on file   Highest education level: Associate degree: occupational, Scientist, product/process development, or vocational program  Occupational History   Occupation: retired  Tobacco Use   Smoking status: Never   Smokeless tobacco: Never  Vaping Use   Vaping status: Never Used  Substance and Sexual Activity   Alcohol use: Not Currently    Comment: beer every 6 months   Drug use: Never   Sexual activity: Not on file  Other Topics Concern   Not on file  Social History Narrative   Not on file   Social Drivers of Health   Financial Resource Strain: Low Risk  (12/04/2023)   Overall Financial Resource Strain (CARDIA)    Difficulty of Paying Living Expenses: Not hard at all  Food Insecurity: No Food Insecurity (12/04/2023)   Hunger Vital Sign    Worried About Running Out of Food in the Last Year: Never true    Ran Out of Food in the Last Year: Never true  Transportation  Needs: No Transportation Needs (12/04/2023)   PRAPARE - Administrator, Civil Service (Medical): No    Lack of Transportation (Non-Medical): No  Physical Activity: Insufficiently Active (12/04/2023)   Exercise Vital Sign    Days of Exercise per Week: 4 days    Minutes of Exercise per Session: 20 min  Stress: No Stress Concern Present (12/04/2023)   [employer] of Occupational Health - Occupational Stress Questionnaire    Feeling of Stress: Not at all  Social Connections: Socially Isolated (12/04/2023)   Social Connection and Isolation Panel    Frequency of Communication with Friends and Family: Once a week    Frequency of Social Gatherings with Friends and Family: Never    Attends Religious Services: Never    Database administrator or Organizations: No    Attends Engineer, structural: Not on file    Marital Status: Married        Objective:  Physical Exam: BP 124/67   Pulse (!) 56   Temp 98.4 F (36.9 C) (Temporal)   Ht 5' 10 (1.778 m)   Wt 171 lb 12.8 oz (77.9 kg)   SpO2 99%   BMI 24.65 kg/m   Body  mass index is 24.65 kg/m. Wt Readings from Last 3 Encounters:  12/05/23 171 lb 12.8 oz (77.9 kg)  10/06/23 175 lb (79.4 kg)  10/13/22 193 lb 6.4 oz (87.7 kg)   Gen: NAD, resting comfortably HEENT: TMs normal bilaterally. OP clear. No thyromegaly noted.  CV: RRR with no murmurs appreciated Pulm: NWOB, CTAB with no crackles, wheezes, or rhonchi GI: Normal bowel sounds present. Soft, Nontender, Nondistended. MSK: no edema, cyanosis, or clubbing noted - Neck: No deformities.  Pain with rightward and leftward rotation.  Also with extension.  Neurovascularly intact distally. Skin: warm, dry.  Erythematous hyperkeratotic lesion noted on right forehead. Neuro: CN2-12 grossly intact. Strength 5/5 in upper and lower extremities. Reflexes symmetric and intact bilaterally.  Psych: Normal affect and thought content     Jackson Markos M. Kennyth, MD 12/05/2023 9:38 AM

## 2023-12-05 NOTE — Assessment & Plan Note (Signed)
 Continue management per urology.

## 2023-12-06 LAB — HEPATITIS C ANTIBODY: Hepatitis C Ab: NONREACTIVE

## 2023-12-07 ENCOUNTER — Ambulatory Visit: Payer: Self-pay | Admitting: Family Medicine

## 2023-12-07 NOTE — Progress Notes (Signed)
 His A1c has increased into the diabetic range.  Recommend he schedule appointment here to discuss treatment options for this.  The rest of his labs are all stable.  His blood counts are slightly low but similar to his last several values.  We can recheck everything else in a year.

## 2023-12-12 ENCOUNTER — Other Ambulatory Visit: Payer: Self-pay | Admitting: *Deleted

## 2023-12-12 DIAGNOSIS — M542 Cervicalgia: Secondary | ICD-10-CM

## 2023-12-12 NOTE — Telephone Encounter (Signed)
 PT referral placed.

## 2023-12-12 NOTE — Progress Notes (Signed)
 His x-ray shows arthritis in his neck.  We can refer him to sports medicine or physical therapy if his pain is not improving.

## 2023-12-13 ENCOUNTER — Ambulatory Visit: Admitting: Family Medicine

## 2023-12-21 ENCOUNTER — Ambulatory Visit: Admitting: Psychiatry

## 2023-12-21 DIAGNOSIS — F4323 Adjustment disorder with mixed anxiety and depressed mood: Secondary | ICD-10-CM | POA: Diagnosis not present

## 2023-12-21 DIAGNOSIS — Z630 Problems in relationship with spouse or partner: Secondary | ICD-10-CM | POA: Diagnosis not present

## 2023-12-21 DIAGNOSIS — F3340 Major depressive disorder, recurrent, in remission, unspecified: Secondary | ICD-10-CM | POA: Diagnosis not present

## 2023-12-21 DIAGNOSIS — Z87898 Personal history of other specified conditions: Secondary | ICD-10-CM
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
## 2023-12-26 ENCOUNTER — Encounter: Payer: Self-pay | Admitting: Family Medicine

## 2023-12-26 ENCOUNTER — Ambulatory Visit: Admitting: Family Medicine

## 2023-12-26 VITALS — BP 125/60 | HR 52 | Temp 98.2°F | Ht 70.0 in | Wt 172.4 lb

## 2023-12-26 DIAGNOSIS — M542 Cervicalgia: Secondary | ICD-10-CM | POA: Insufficient documentation

## 2023-12-26 DIAGNOSIS — R7303 Prediabetes: Secondary | ICD-10-CM | POA: Diagnosis not present

## 2023-12-26 MED ORDER — METFORMIN HCL 500 MG PO TABS: 500.0000 mg | ORAL_TABLET | Freq: Every day | ORAL | 3 refills | Status: AC

## 2023-12-26 NOTE — Patient Instructions (Signed)
 It was very nice to see you today!  VISIT SUMMARY: Today, we discussed your elevated A1c levels and confirmed a diagnosis of Type 2 diabetes. We also reviewed your neck issues and potential treatments.  YOUR PLAN: TYPE 2 DIABETES MELLITUS: Your A1c level is 6.6%, which confirms diabetes. -Start taking metformin  as prescribed. Be aware that it may cause diarrhea. -Make dietary changes to reduce sugar and carbohydrate intake. -Try to cut down on your Coca-Cola consumption. -We will check your A1c again in three months. -Report any side effects from metformin .  CERVICAL SPONDYLOSIS: You have arthritis-like changes in your neck with bone spurs and narrowed disc space. -Start physical therapy -If physical therapy does not help, we may refer you to sports medicine.  Return in about 3 months (around 03/27/2024) for Follow Up.   Take care, Dr Kennyth  PLEASE NOTE:  If you had any lab tests, please let us  know if you have not heard back within a few days. You may see your results on mychart before we have a chance to review them but we will give you a call once they are reviewed by us .   If we ordered any referrals today, please let us  know if you have not heard from their office within the next week.   If you had any urgent prescriptions sent in today, please check with the pharmacy within an hour of our visit to make sure the prescription was transmitted appropriately.   Please try these tips to maintain a healthy lifestyle:  Eat at least 3 REAL meals and 1-2 snacks per day.  Aim for no more than 5 hours between eating.  If you eat breakfast, please do so within one hour of getting up.   Each meal should contain half fruits/vegetables, one quarter protein, and one quarter carbs (no bigger than a computer mouse)  Cut down on sweet beverages. This includes juice, soda, and sweet tea.   Drink at least 1 glass of water  with each meal and aim for at least 8 glasses per day  Exercise at  least 150 minutes every week.

## 2023-12-26 NOTE — Assessment & Plan Note (Signed)
 Pain is still been persistent since our last visit.  Recent x-rays showed degenerative changes. Will refer to physical therapy. He will let us  know if not improving.

## 2023-12-26 NOTE — Progress Notes (Signed)
   Jackson [name] is a 68 y.o. male who presents today for an office visit.  Assessment/Plan:  Chronic Problems Addressed Today: Prediabetes Schedule patient regarding most recent A1c 6.6 which is in the diabetic range.  We did discuss lifestyle modifications as well as medication management options.  He will work on cutting down sugar and carbohydrates.  Will start metformin  500 mg daily.  We also did discuss GLP agonist however he would like to hold off on this for now.  Recheck A1c in 3 months.  Neck pain Pain is still been persistent since our last visit.  Recent x-rays showed degenerative changes. Will refer to physical therapy. He will let us  know if not improving.      Subjective:  HPI:  See assessment / plan for status of chronic conditions.    Discussed the use of AI scribe software for clinical note transcription with the patient, who gave verbal consent to proceed.  History of Present Illness Jackson [name] is a 68 year old male who presents for follow-up regarding elevated A1c levels.  Three weeks ago, during his annual physical, his lab results showed an A1c level of 6.6%, which falls into the diabetic range. He is not currently on any medication for diabetes.  He has made significant dietary changes, including reducing chocolate consumption. He eats yogurt with granola, strawberries, and blueberries for breakfast and suspects the sugar content in the yogurt might be contributing to his elevated A1c. He drinks three to four cans of Coke daily, a habit since he was [age].  He has a history of neck issues, including bone spurs and narrowed disc spaces. He has previously consulted Dr. Joane for his back and is considering physical therapy for his neck.  His brother has had problems with diabetes, although his parents did not. His brother's condition may have been influenced by being overweight.  He has experienced firmer stools and has taken stool softeners, which resulted in  the opposite effect, raising concerns about potential side effects of new medications.         Objective:  Physical Exam: BP 125/60   Pulse (!) 52   Temp 98.2 F (36.8 C) (Temporal)   Ht 5' 10 (1.778 m)   Wt 172 lb 6.4 oz (78.2 kg)   SpO2 97%   BMI 24.74 kg/m   Gen: No acute distress, resting comfortably Neuro: Grossly normal, moves all extremities Psych: Normal affect and thought content      Alazia Crocket M. Kennyth, MD 12/26/2023 8:40 AM

## 2023-12-26 NOTE — Assessment & Plan Note (Signed)
 Schedule patient regarding most recent A1c 6.6 which is in the diabetic range.  We did discuss lifestyle modifications as well as medication management options.  He will work on cutting down sugar and carbohydrates.  Will start metformin  500 mg daily.  We also did discuss GLP agonist however he would like to hold off on this for now.  Recheck A1c in 3 months.

## 2023-12-28 ENCOUNTER — Encounter: Payer: Self-pay | Admitting: Physical Therapy

## 2023-12-28 ENCOUNTER — Ambulatory Visit: Admitting: Physical Therapy

## 2023-12-28 DIAGNOSIS — M542 Cervicalgia: Secondary | ICD-10-CM

## 2023-12-28 NOTE — Therapy (Signed)
 OUTPATIENT PHYSICAL THERAPY UPPER EXTREMITY EVALUATION   Patient Name: Jackson [name] MRN: [id number] DOB:06/17/1955, 68 y.o., male Today's Date: 12/28/2023  END OF SESSION:  PT End of Session - 12/28/23 1350     Visit Number 1    Number of Visits 16    Date for PT Re-Evaluation 02/22/24    Authorization Type [employer] Medicare    PT Start Time 1015    PT Stop Time 1055    PT Time Calculation (min) 40 min    Activity Tolerance Patient tolerated treatment well    Behavior During Therapy WFL for tasks assessed/performed          Past Medical History:  Diagnosis Date   Anginal pain (HCC) 2019   heat stress. stress test was negative   Arthritis    Diverticulitis    Diverticulosis    HLD (hyperlipidemia)    Prostate cancer [facility])    Past Surgical History:  Procedure Laterality Date   CHOLECYSTECTOMY  05/18/2007   CYSTOSCOPY WITH STENT PLACEMENT N/A 03/28/2020   Procedure: FIREFLY INJECTION;  Surgeon: Cam Morene ORN, MD;  Location: [facility];  Service: Urology;  Laterality: N/A;   EYE SURGERY     PROSTATECTOMY  2011   REFRACTIVE SURGERY Bilateral    eyes   TONSILLECTOMY     Patient Active Problem List   Diagnosis Date Noted   Neck pain 12/26/2023   B12 deficiency 01/26/2022   Normocytic anemia 01/26/2022   Overweight (BMI 25.0-29.9) 10/28/2021   Prediabetes 08/20/2021   Actinic keratosis 09/12/2020   Diverticular disease s/p robotic sigmoidectomy 2021 03/28/2020   Dyslipidemia 08/01/2019   Prostate cancer (HCC) s/p prostatectomy 08/01/2019   Osteoarthritis 08/01/2019    PCP: Worth Kitty  REFERRING PROVIDER: Worth Kitty  REFERRING DIAG: Neck pain  THERAPY DIAG:  Cervicalgia  Rationale for Evaluation and Treatment: Rehabilitation  ONSET DATE:   SUBJECTIVE:                                                                                                                                                                                      SUBJECTIVE  STATEMENT: New onset neck pain in last 6 months. No incident to report. He is Retired, but active, remodeling the house . Notes increased pain with activity, as well as stiffness after prolonged positioning, reading or sleeping. Stiff in AM. Likes to sleep on his back, got new pillow which he thinks is helping a bit.  Hand dominance: Right   PERTINENT HISTORY: Previous prostate CA,    PAIN:  Are you having pain? Yes: NPRS scale: 3 /10 Pain location: neck , L, UT and R SO region  Pain description: stiff, sore  Aggravating factors: driving,  rotation and extension,  Relieving factors: none stated   PRECAUTIONS: None  RED FLAGS: None   WEIGHT BEARING RESTRICTIONS: No  FALLS:  Has patient fallen in last 6 months? No   PLOF: Independent  PATIENT GOALS: Decreased pain  NEXT MD VISIT:   OBJECTIVE:   DIAGNOSTIC FINDINGS:  FINDINGS: There is no evidence of cervical spine fracture or prevertebral soft tissue swelling. Alignment is normal. There is disc space narrowing at C5-C6 and C6-C7. Anterior osteophytes are seen throughout mid and lower cervical endplates. The bones are diffusely osteopenic. C1-C2 intervals within normal limits on the open-mouth view. Neural foramina appear patent bilaterally.   IMPRESSION: 1. No acute fracture or malalignment. 2. Degenerative changes of the cervical spine.    PATIENT SURVEYS :    COGNITION: Overall cognitive status: Within functional limits for tasks assessed     SENSATION: WFL  POSTURE:   UPPER EXTREMITY ROM:   Cervcial : flexion: WFL,  L rotation: 65,  R rotation: 60,  Extension: mild limitation (pain) UEs : WFL   UPPER EXTREMITY MMT:  Shoulders: 4+/5    SHOULDER SPECIAL TESTS:   JOINT MOBILITY TESTING:    PALPATION:  Tightness in bil UT s, mild tenderness in L sided parspinals, Tenderness in R>L sub occipitals  Mild hypomobility for extension and R rotation     TODAY'S TREATMENT:                                                                                                                                          DATE:  Eval:  Ther ex:See below for HEP Self care: education on optimal pillow height and position for decreasing pain while sleeping, pain management, use of mobility and heat, and posture for decreasing neck tension.   PATIENT EDUCATION:  Education details: PT POC, Exam findings, HEP Person educated: Patient Education method: Explanation, Demonstration, Tactile cues, Verbal cues, and Handouts Education comprehension: verbalized understanding, returned demonstration, verbal cues required, tactile cues required, and needs further education   HOME EXERCISE PROGRAM: Access Code: [email or web address] URL: [email or web address] Date: 12/28/2023 Prepared by: Tinnie Don  Exercises - Standing Backward Shoulder Rolls  - 2-3 x daily - 1 sets - 10 reps - Standing Scapular Retraction  - 2-3 x daily - 1 sets - 10 reps - Supine Cervical Rotation AROM on Pillow  - 2 x daily - 1 sets - 10 reps - Seated Correct Posture   ASSESSMENT:  CLINICAL IMPRESSION: Eval:  Patient presents with primary complaint of pain and stiffness in neck. He has mild lack of ROM and increased pain with extension and rotation. He has increased muscle tension in Sub occipitals and poor, fwd head posture. He has lack of effective HEP for ongoing symptoms. Pt with decreased ability for full functional activities, reading and writing, due to pain.  Pt to benefit from  skilled PT to improve deficits and pain.    OBJECTIVE IMPAIRMENTS: decreased activity tolerance, decreased ROM, decreased strength, hypomobility, impaired flexibility, impaired UE functional use, improper body mechanics, and pain.   ACTIVITY LIMITATIONS: lifting, sleeping, and locomotion level  PARTICIPATION LIMITATIONS: meal prep, cleaning, laundry, driving, shopping, community activity, and yard work  PERSONAL FACTORS: Past/current  experiences and Time since onset of injury/illness/exacerbation are also affecting patient's functional outcome.   REHAB POTENTIAL: Good  CLINICAL DECISION MAKING: Stable/uncomplicated  EVALUATION COMPLEXITY: Low  GOALS: Goals reviewed with patient? Yes   SHORT TERM GOALS: Target date: 01/18/2024    Pt to be independent with initial HEP  Goal status: INITIAL  2.  Pt to report decreased pain to 2/10 with reading/writing activities.   Goal status: INITIAL    LONG TERM GOALS: Target date: 02/22/2024   Pt to be independent with final HEP  Goal status: INITIAL  2.  Pt to demo improved neck ROM to be [facility] and pain free, to improve ability for ADLs.   Goal status: INITIAL  3.  Pt to report decreased pain in neck to 0-2/10 with reading/writing activity and in AM.   Goal status: INITIAL  4.  Pt to report overall improvement in symptoms by at least 75 %.   Goal status: INITIAL    PLAN: PT FREQUENCY: 1-2x/week  PT DURATION: 8 weeks  PLANNED INTERVENTIONS: Therapeutic exercises, Therapeutic activity, Neuromuscular re-education, Patient/Family education, Self Care, Joint mobilization, Joint manipulation, Stair training, DME instructions, Aquatic Therapy, Dry Needling, Electrical stimulation, Cryotherapy, Moist heat, Taping, Ultrasound, Ionotophoresis 4mg /ml Dexamethasone , Manual therapy,  Vasopneumatic device, Traction, Spinal manipulation, Spinal mobilization,    PLAN FOR NEXT SESSION:   Tinnie Don, PT, DPT 5:36 PM  12/28/23

## 2023-12-29 ENCOUNTER — Ambulatory Visit

## 2024-01-05 ENCOUNTER — Encounter: Payer: Self-pay | Admitting: Physical Therapy

## 2024-01-05 ENCOUNTER — Ambulatory Visit: Admitting: Physical Therapy

## 2024-01-05 DIAGNOSIS — M542 Cervicalgia: Secondary | ICD-10-CM | POA: Diagnosis not present

## 2024-01-05 NOTE — Therapy (Signed)
 OUTPATIENT PHYSICAL THERAPY UPPER EXTREMITY TREATMENT   Patient Name: Jackson [name] MRN: [id number] DOB:1955-11-27, 68 y.o., male Today's Date: 01/05/2024  END OF SESSION:  PT End of Session - 01/05/24 1028     Visit Number 2    Number of Visits 16    Date for PT Re-Evaluation 02/22/24    Authorization Type [employer] Medicare    PT Start Time 1025    PT Stop Time 1103    PT Time Calculation (min) 38 min    Activity Tolerance Patient tolerated treatment well    Behavior During Therapy WFL for tasks assessed/performed           Past Medical History:  Diagnosis Date   Anginal pain (HCC) 2019   heat stress. stress test was negative   Arthritis    Diverticulitis    Diverticulosis    HLD (hyperlipidemia)    Prostate cancer [facility])    Past Surgical History:  Procedure Laterality Date   CHOLECYSTECTOMY  05/18/2007   CYSTOSCOPY WITH STENT PLACEMENT N/A 03/28/2020   Procedure: FIREFLY INJECTION;  Surgeon: Cam Morene ORN, MD;  Location: [facility];  Service: Urology;  Laterality: N/A;   EYE SURGERY     PROSTATECTOMY  2011   REFRACTIVE SURGERY Bilateral    eyes   TONSILLECTOMY     Patient Active Problem List   Diagnosis Date Noted   Neck pain 12/26/2023   B12 deficiency 01/26/2022   Normocytic anemia 01/26/2022   Overweight (BMI 25.0-29.9) 10/28/2021   Prediabetes 08/20/2021   Actinic keratosis 09/12/2020   Diverticular disease s/p robotic sigmoidectomy 2021 03/28/2020   Dyslipidemia 08/01/2019   Prostate cancer (HCC) s/p prostatectomy 08/01/2019   Osteoarthritis 08/01/2019    PCP: Worth Kitty  REFERRING PROVIDER: Worth Kitty  REFERRING DIAG: Neck pain  THERAPY DIAG:  Cervicalgia  Rationale for Evaluation and Treatment: Rehabilitation  ONSET DATE:   SUBJECTIVE:                                                                                                                                                                                      SUBJECTIVE  STATEMENT: Pt states soreness on L side of neck, with motion. Feels stiff. Has been doing HEP.   Eval: New onset neck pain in last 6 months. No incident to report. He is Retired, but active, remodeling the house . Notes increased pain with activity, as well as stiffness after prolonged positioning, reading or sleeping. Stiff in AM. Likes to sleep on his back, got new pillow which he thinks is helping a bit.  Hand dominance: Right   PERTINENT HISTORY: Previous prostate CA,    PAIN:  Are you having pain? Yes: NPRS scale: 3 /10 Pain location: neck , L, UT and R SO region  Pain description: stiff, sore  Aggravating factors: driving,  rotation and extension,  Relieving factors: none stated   PRECAUTIONS: None  RED FLAGS: None   WEIGHT BEARING RESTRICTIONS: No  FALLS:  Has patient fallen in last 6 months? No   PLOF: Independent  PATIENT GOALS: Decreased pain  NEXT MD VISIT:   OBJECTIVE:   DIAGNOSTIC FINDINGS:  FINDINGS: There is no evidence of cervical spine fracture or prevertebral soft tissue swelling. Alignment is normal. There is disc space narrowing at C5-C6 and C6-C7. Anterior osteophytes are seen throughout mid and lower cervical endplates. The bones are diffusely osteopenic. C1-C2 intervals within normal limits on the open-mouth view. Neural foramina appear patent bilaterally.   IMPRESSION: 1. No acute fracture or malalignment. 2. Degenerative changes of the cervical spine.    PATIENT SURVEYS :    COGNITION: Overall cognitive status: Within functional limits for tasks assessed     SENSATION: WFL  POSTURE:   UPPER EXTREMITY ROM:   Cervcial : flexion: WFL,  L rotation: 65,  R rotation: 60,  Extension: mild limitation (pain) UEs : WFL   UPPER EXTREMITY MMT:  Shoulders: 4+/5    SHOULDER SPECIAL TESTS:   JOINT MOBILITY TESTING:    PALPATION:  Tightness in bil UT s, mild tenderness in L sided parspinals, Tenderness in R>L sub occipitals   Mild hypomobility for extension and R rotation     TODAY'S TREATMENT:                                                                                                                                         DATE:   01/05/2024 Therapeutic Exercise: Aerobic: Supine: shoulder flexion/cane for t-spine mobility x 10;  Seated:  cervical flexion x 10,  Rotation to R- helpful x 15, Rotation to L sore on L.  Standing: Scap squeeze x 15,  Rows Blue TB x 20;   Doorway stretch 30 sec x 3 at 2 heights.  Stretches:  Neuromuscular Re-education: Manual Therapy: STM L paraspinals, UT;  Cervical distraction; UT stretching bil; Cervical PA s ;  Therapeutic Activity: Self Care:   Eval:  Ther ex:See below for HEP Self care: education on optimal pillow height and position for decreasing pain while sleeping, pain management, use of mobility and heat, and posture for decreasing neck tension.   PATIENT EDUCATION:  Education details: updated and reviewed HEP.  Person educated: Patient Education method: Explanation, Demonstration, Tactile cues, Verbal cues, and Handouts Education comprehension: verbalized understanding, returned demonstration, verbal cues required, tactile cues required, and needs further education   HOME EXERCISE PROGRAM: Access Code: [email or web address] URL: [email or web address] Date: 12/28/2023 Prepared by: Tinnie Don  Exercises - Standing Backward Shoulder Rolls  - 2-3 x daily - 1 sets - 10 reps - Standing Scapular Retraction  -  2-3 x daily - 1 sets - 10 reps - Supine Cervical Rotation AROM on Pillow  - 2 x daily - 1 sets - 10 reps - Seated Correct Posture   ASSESSMENT:  CLINICAL IMPRESSION: 01/05/2024 Pt with good tolerance for activity. He has slight improved pain on L with R rotation, but continued soreness on L with L rotation. Plan to continue manual and ther ex for pain relief and to restore motion.   Eval:  Patient presents with primary complaint of pain and  stiffness in neck. He has mild lack of ROM and increased pain with extension and rotation. He has increased muscle tension in Sub occipitals and poor, fwd head posture. He has lack of effective HEP for ongoing symptoms. Pt with decreased ability for full functional activities, reading and writing, due to pain.  Pt to benefit from skilled PT to improve deficits and pain.    OBJECTIVE IMPAIRMENTS: decreased activity tolerance, decreased ROM, decreased strength, hypomobility, impaired flexibility, impaired UE functional use, improper body mechanics, and pain.   ACTIVITY LIMITATIONS: lifting, sleeping, and locomotion level  PARTICIPATION LIMITATIONS: meal prep, cleaning, laundry, driving, shopping, community activity, and yard work  PERSONAL FACTORS: Past/current experiences and Time since onset of injury/illness/exacerbation are also affecting patient's functional outcome.   REHAB POTENTIAL: Good  CLINICAL DECISION MAKING: Stable/uncomplicated  EVALUATION COMPLEXITY: Low  GOALS: Goals reviewed with patient? Yes   SHORT TERM GOALS: Target date: 01/18/2024    Pt to be independent with initial HEP  Goal status: INITIAL  2.  Pt to report decreased pain to 2/10 with reading/writing activities.   Goal status: INITIAL    LONG TERM GOALS: Target date: 02/22/2024   Pt to be independent with final HEP  Goal status: INITIAL  2.  Pt to demo improved neck ROM to be [facility] and pain free, to improve ability for ADLs.   Goal status: INITIAL  3.  Pt to report decreased pain in neck to 0-2/10 with reading/writing activity and in AM.   Goal status: INITIAL  4.  Pt to report overall improvement in symptoms by at least 75 %.   Goal status: INITIAL    PLAN: PT FREQUENCY: 1-2x/week  PT DURATION: 8 weeks  PLANNED INTERVENTIONS: Therapeutic exercises, Therapeutic activity, Neuromuscular re-education, Patient/Family education, Self Care, Joint mobilization, Joint manipulation, Stair  training, DME instructions, Aquatic Therapy, Dry Needling, Electrical stimulation, Cryotherapy, Moist heat, Taping, Ultrasound, Ionotophoresis 4mg /ml Dexamethasone , Manual therapy,  Vasopneumatic device, Traction, Spinal manipulation, Spinal mobilization,    PLAN FOR NEXT SESSION:   Tinnie Don, PT, DPT 11:49 AM  01/05/24

## 2024-01-10 ENCOUNTER — Ambulatory Visit: Payer: Self-pay | Admitting: Physical Therapy

## 2024-01-10 DIAGNOSIS — M542 Cervicalgia: Secondary | ICD-10-CM | POA: Diagnosis not present

## 2024-01-10 NOTE — Therapy (Signed)
 OUTPATIENT PHYSICAL THERAPY UPPER EXTREMITY TREATMENT   Patient Name: Jackson [name] MRN: [id number] DOB:April 28, 1956, 68 y.o., male Today's Date: 01/10/2024  END OF SESSION:  PT End of Session - 01/10/24 0921     Visit Number 3    Number of Visits 16    Date for PT Re-Evaluation 02/22/24    Authorization Type [employer] Medicare    PT Start Time 0846    PT Stop Time 0925    PT Time Calculation (min) 39 min    Activity Tolerance Patient tolerated treatment well    Behavior During Therapy [facility] for tasks assessed/performed            Past Medical History:  Diagnosis Date   Anginal pain (HCC) 2019   heat stress. stress test was negative   Arthritis    Diverticulitis    Diverticulosis    HLD (hyperlipidemia)    Prostate cancer [facility])    Past Surgical History:  Procedure Laterality Date   CHOLECYSTECTOMY  05/18/2007   CYSTOSCOPY WITH STENT PLACEMENT N/A 03/28/2020   Procedure: FIREFLY INJECTION;  Surgeon: Cam Morene ORN, MD;  Location: [facility];  Service: Urology;  Laterality: N/A;   EYE SURGERY     PROSTATECTOMY  2011   REFRACTIVE SURGERY Bilateral    eyes   TONSILLECTOMY     Patient Active Problem List   Diagnosis Date Noted   Neck pain 12/26/2023   B12 deficiency 01/26/2022   Normocytic anemia 01/26/2022   Overweight (BMI 25.0-29.9) 10/28/2021   Prediabetes 08/20/2021   Actinic keratosis 09/12/2020   Diverticular disease s/p robotic sigmoidectomy 2021 03/28/2020   Dyslipidemia 08/01/2019   Prostate cancer (HCC) s/p prostatectomy 08/01/2019   Osteoarthritis 08/01/2019    PCP: Worth Kitty  REFERRING PROVIDER: Worth Kitty  REFERRING DIAG: Neck pain  THERAPY DIAG:  Cervicalgia  Rationale for Evaluation and Treatment: Rehabilitation  ONSET DATE:   SUBJECTIVE:                                                                                                                                                                                      SUBJECTIVE  STATEMENT: Pt states improved pain in neck after pressure washing over the weekend.  Notes mild pain, but improving overall. Still sore on L, especiallywith L rotation.   Eval: New onset neck pain in last 6 months. No incident to report. He is Retired, but active, remodeling the house . Notes increased pain with activity, as well as stiffness after prolonged positioning, reading or sleeping. Stiff in AM. Likes to sleep on his back, got new pillow which he thinks is helping a bit.  Hand dominance: Right  PERTINENT HISTORY: Previous prostate CA,    PAIN:  Are you having pain? Yes: NPRS scale: 3 /10 Pain location: neck , L, UT and R SO region  Pain description: stiff, sore  Aggravating factors: driving,  rotation and extension,  Relieving factors: none stated   PRECAUTIONS: None  RED FLAGS: None   WEIGHT BEARING RESTRICTIONS: No  FALLS:  Has patient fallen in last 6 months? No   PLOF: Independent  PATIENT GOALS: Decreased pain  NEXT MD VISIT:   OBJECTIVE:   DIAGNOSTIC FINDINGS:  FINDINGS: There is no evidence of cervical spine fracture or prevertebral soft tissue swelling. Alignment is normal. There is disc space narrowing at C5-C6 and C6-C7. Anterior osteophytes are seen throughout mid and lower cervical endplates. The bones are diffusely osteopenic. C1-C2 intervals within normal limits on the open-mouth view. Neural foramina appear patent bilaterally.   IMPRESSION: 1. No acute fracture or malalignment. 2. Degenerative changes of the cervical spine.    PATIENT SURVEYS :    COGNITION: Overall cognitive status: Within functional limits for tasks assessed     SENSATION: WFL  POSTURE:   UPPER EXTREMITY ROM:   Cervcial : flexion: WFL,  L rotation: 65,  R rotation: 60,  Extension: mild limitation (pain) UEs : WFL   UPPER EXTREMITY MMT:  Shoulders: 4+/5    SHOULDER SPECIAL TESTS:   JOINT MOBILITY TESTING:    PALPATION:  Tightness in bil UT s,  mild tenderness in L sided parspinals, Tenderness in R>L sub occipitals  Mild hypomobility for extension and R rotation     TODAY'S TREATMENT:                                                                                                                                         DATE:   01/10/2024 Therapeutic Exercise: Aerobic: Supine:  Seated:  cervical flexion x 10,  Rotation to R with light overpressure - helpful x 15,  Rotation to L x 10 slight overpressure (improved tightness with repeated motions).  Standing:  Rows Blue TB x 20;   Low Row Gtb x 15;  Shoulder ER rom x 10,  with YTB x 15; Doorway stretch 30 sec x 3  Stretches:  Neuromuscular Re-education: Manual Therapy:   Cervical distraction; UT stretching bil; Cervical PA s ; Cervical retraction for neutral spine, with rotation  Therapeutic Activity: Self Care:    Therapeutic Exercise: Aerobic: Supine: shoulder flexion/cane for t-spine mobility x 10;  Seated:  cervical flexion x 10,  Rotation to R- helpful x 15, Rotation to L sore on L.  Standing: Scap squeeze x 15,  Rows Blue TB x 20;   Doorway stretch 30 sec x 3 at 2 heights.  Stretches:  Neuromuscular Re-education: Manual Therapy:  STM L paraspinals, UT;  Cervical distraction; UT stretching bil; Cervical PA s ;  Therapeutic Activity: Self Care:   Eval:  Ther ex:See  below for HEP Self care: education on optimal pillow height and position for decreasing pain while sleeping, pain management, use of mobility and heat, and posture for decreasing neck tension.   PATIENT EDUCATION:  Education details: updated and reviewed HEP.  Person educated: Patient Education method: Explanation, Demonstration, Tactile cues, Verbal cues, and Handouts Education comprehension: verbalized understanding, returned demonstration, verbal cues required, tactile cues required, and needs further education   HOME EXERCISE PROGRAM: Access Code: [email or web address] URL:  [email or web address] Date: 12/28/2023 Prepared by: Tinnie Don  Exercises - Standing Backward Shoulder Rolls  - 2-3 x daily - 1 sets - 10 reps - Standing Scapular Retraction  - 2-3 x daily - 1 sets - 10 reps - Supine Cervical Rotation AROM on Pillow  - 2 x daily - 1 sets - 10 reps - Seated Correct Posture   ASSESSMENT:  CLINICAL IMPRESSION: 01/10/2024 Pt with good tolerance for activity. He has improving rotation rom overall.  He has slight improved pain on L with  rotation, but with mild continued soreness .Plan to continue manual and ther ex for pain relief and to restore motion.   Eval:  Patient presents with primary complaint of pain and stiffness in neck. He has mild lack of ROM and increased pain with extension and rotation. He has increased muscle tension in Sub occipitals and poor, fwd head posture. He has lack of effective HEP for ongoing symptoms. Pt with decreased ability for full functional activities, reading and writing, due to pain.  Pt to benefit from skilled PT to improve deficits and pain.    OBJECTIVE IMPAIRMENTS: decreased activity tolerance, decreased ROM, decreased strength, hypomobility, impaired flexibility, impaired UE functional use, improper body mechanics, and pain.   ACTIVITY LIMITATIONS: lifting, sleeping, and locomotion level  PARTICIPATION LIMITATIONS: meal prep, cleaning, laundry, driving, shopping, community activity, and yard work  PERSONAL FACTORS: Past/current experiences and Time since onset of injury/illness/exacerbation are also affecting patient's functional outcome.   REHAB POTENTIAL: Good  CLINICAL DECISION MAKING: Stable/uncomplicated  EVALUATION COMPLEXITY: Low  GOALS: Goals reviewed with patient? Yes   SHORT TERM GOALS: Target date: 01/18/2024   Pt to be independent with initial HEP  Goal status: INITIAL  2.  Pt to report decreased pain to 2/10 with reading/writing activities.   Goal status:  INITIAL    LONG TERM GOALS: Target date: 02/22/2024   Pt to be independent with final HEP  Goal status: INITIAL  2.  Pt to demo improved neck ROM to be [facility] and pain free, to improve ability for ADLs.   Goal status: INITIAL  3.  Pt to report decreased pain in neck to 0-2/10 with reading/writing activity and in AM.   Goal status: INITIAL  4.  Pt to report overall improvement in symptoms by at least 75 %.   Goal status: INITIAL    PLAN: PT FREQUENCY: 1-2x/week  PT DURATION: 8 weeks  PLANNED INTERVENTIONS: Therapeutic exercises, Therapeutic activity, Neuromuscular re-education, Patient/Family education, Self Care, Joint mobilization, Joint manipulation, Stair training, DME instructions, Aquatic Therapy, Dry Needling, Electrical stimulation, Cryotherapy, Moist heat, Taping, Ultrasound, Ionotophoresis 4mg /ml Dexamethasone , Manual therapy,  Vasopneumatic device, Traction, Spinal manipulation, Spinal mobilization,    PLAN FOR NEXT SESSION:   Tinnie Don, PT, DPT 11:31 AM  01/10/24

## 2024-01-13 ENCOUNTER — Other Ambulatory Visit: Payer: Self-pay | Admitting: Family Medicine

## 2024-01-13 DIAGNOSIS — E785 Hyperlipidemia, unspecified: Secondary | ICD-10-CM

## 2024-01-20 ENCOUNTER — Ambulatory Visit: Admitting: Physical Therapy

## 2024-01-20 ENCOUNTER — Encounter: Payer: Self-pay | Admitting: Physical Therapy

## 2024-01-20 DIAGNOSIS — M542 Cervicalgia: Secondary | ICD-10-CM

## 2024-01-20 NOTE — Therapy (Signed)
 OUTPATIENT PHYSICAL THERAPY UPPER EXTREMITY TREATMENT   Patient Name: Jackson [name] MRN: [id number] DOB:1956/05/03, 68 y.o., male Today's Date: 01/20/2024  END OF SESSION:  PT End of Session - 01/20/24 1136     Visit Number 4    Number of Visits 16    Date for PT Re-Evaluation 02/22/24    Authorization Type [employer] Medicare    PT Start Time 1140    PT Stop Time 1218    PT Time Calculation (min) 38 min    Activity Tolerance Patient tolerated treatment well    Behavior During Therapy WFL for tasks assessed/performed            Past Medical History:  Diagnosis Date   Anginal pain (HCC) 2019   heat stress. stress test was negative   Arthritis    Diverticulitis    Diverticulosis    HLD (hyperlipidemia)    Prostate cancer [facility])    Past Surgical History:  Procedure Laterality Date   CHOLECYSTECTOMY  05/18/2007   CYSTOSCOPY WITH STENT PLACEMENT N/A 03/28/2020   Procedure: FIREFLY INJECTION;  Surgeon: Cam Morene ORN, MD;  Location: [facility];  Service: Urology;  Laterality: N/A;   EYE SURGERY     PROSTATECTOMY  2011   REFRACTIVE SURGERY Bilateral    eyes   TONSILLECTOMY     Patient Active Problem List   Diagnosis Date Noted   Neck pain 12/26/2023   B12 deficiency 01/26/2022   Normocytic anemia 01/26/2022   Overweight (BMI 25.0-29.9) 10/28/2021   Prediabetes 08/20/2021   Actinic keratosis 09/12/2020   Diverticular disease s/p robotic sigmoidectomy 2021 03/28/2020   Dyslipidemia 08/01/2019   Prostate cancer (HCC) s/p prostatectomy 08/01/2019   Osteoarthritis 08/01/2019    PCP: Worth Kitty  REFERRING PROVIDER: Worth Kitty  REFERRING DIAG: Neck pain  THERAPY DIAG:  Cervicalgia  Rationale for Evaluation and Treatment: Rehabilitation  ONSET DATE:   SUBJECTIVE:                                                                                                                                                                                      SUBJECTIVE  STATEMENT: Pt states slightly less pain overall, but still bothersome. Feels stiff on L side, with pain with movement. Doing well with continuing activities around house without pain.   Eval: New onset neck pain in last 6 months. No incident to report. He is Retired, but active, remodeling the house . Notes increased pain with activity, as well as stiffness after prolonged positioning, reading or sleeping. Stiff in AM. Likes to sleep on his back, got new pillow which he thinks is helping a bit.  Hand dominance: Right  PERTINENT HISTORY: Previous prostate CA,    PAIN:  Are you having pain? Yes: NPRS scale: 2 /10 Pain location: neck , L, UT and R SO region  Pain description: stiff, sore  Aggravating factors: driving,  rotation and extension,  Relieving factors: none stated   PRECAUTIONS: None  RED FLAGS: None   WEIGHT BEARING RESTRICTIONS: No  FALLS:  Has patient fallen in last 6 months? No   PLOF: Independent  PATIENT GOALS: Decreased pain  NEXT MD VISIT:   OBJECTIVE:   DIAGNOSTIC FINDINGS:  FINDINGS: There is no evidence of cervical spine fracture or prevertebral soft tissue swelling. Alignment is normal. There is disc space narrowing at C5-C6 and C6-C7. Anterior osteophytes are seen throughout mid and lower cervical endplates. The bones are diffusely osteopenic. C1-C2 intervals within normal limits on the open-mouth view. Neural foramina appear patent bilaterally.   IMPRESSION: 1. No acute fracture or malalignment. 2. Degenerative changes of the cervical spine.    PATIENT SURVEYS :    COGNITION: Overall cognitive status: Within functional limits for tasks assessed     SENSATION: WFL  POSTURE:   UPPER EXTREMITY ROM:   Cervcial : flexion: WFL,  L rotation: 65,  R rotation: 60,  Extension: mild limitation (pain) UEs : WFL   UPPER EXTREMITY MMT:  Shoulders: 4+/5    SHOULDER SPECIAL TESTS:   JOINT MOBILITY TESTING:    PALPATION:   Tightness in bil UT s, mild tenderness in L sided parspinals, Tenderness in R>L sub occipitals  Mild hypomobility for extension and R rotation     TODAY'S TREATMENT:                                                                                                                                         DATE:   01/20/2024 Therapeutic Exercise: Aerobic: Supine:  Quadruped:  chin tucks x 10 for posterior activation Seated:  chin tucks x 10; cervical flexion x 10,  Rotation to R with light overpressure - helpful x 15,  Rotation to L x 10 (sore)  Standing:    Low Row Gtb x 20;  Shoulder ER   with RTB x 20;     Stretches:  Neuromuscular Re-education: Manual Therapy:   Cervical distraction; UT stretching bil; Cervical PA s ; STM L cervical musculature  Therapeutic Activity: Self Care:    Therapeutic Exercise: Aerobic: Supine:  Seated:  cervical flexion x 10,  Rotation to R with light overpressure - helpful x 15,  Rotation to L x 10 slight overpressure (improved tightness with repeated motions).  Standing:  Rows Blue TB x 20;   Low Row Gtb x 15;  Shoulder ER rom x 10,  with YTB x 15; Doorway stretch 30 sec x 3  Stretches:  Neuromuscular Re-education: Manual Therapy:   Cervical distraction; UT stretching bil; Cervical PA s ; Cervical retraction for neutral spine, with rotation  Therapeutic Activity: Self Care:    Therapeutic Exercise: Aerobic: Supine: shoulder flexion/cane for t-spine mobility x 10;  Seated:  cervical flexion x 10,  Rotation to R- helpful x 15, Rotation to L sore on L.  Standing: Scap squeeze x 15,  Rows Blue TB x 20;   Doorway stretch 30 sec x 3 at 2 heights.  Stretches:  Neuromuscular Re-education: Manual Therapy:  STM L paraspinals, UT;  Cervical distraction; UT stretching bil; Cervical PA s ;  Therapeutic Activity: Self Care:   Eval:  Ther ex:See below for HEP Self care: education on optimal pillow height and position for decreasing pain while sleeping, pain  management, use of mobility and heat, and posture for decreasing neck tension.   PATIENT EDUCATION:  Education details: updated and reviewed HEP.  Person educated: Patient Education method: Explanation, Demonstration, Tactile cues, Verbal cues, and Handouts Education comprehension: verbalized understanding, returned demonstration, verbal cues required, tactile cues required, and needs further education   HOME EXERCISE PROGRAM: Access Code: [email or web address] URL: [email or web address] Date: 12/28/2023 Prepared by: Tinnie Don  Exercises - Standing Backward Shoulder Rolls  - 2-3 x daily - 1 sets - 10 reps - Standing Scapular Retraction  - 2-3 x daily - 1 sets - 10 reps - Supine Cervical Rotation AROM on Pillow  - 2 x daily - 1 sets - 10 reps - Seated Correct Posture   ASSESSMENT:  CLINICAL IMPRESSION: 01/20/2024 Pt with good tolerance for activity. He has improving rotation rom overall.  He continues to have pain/pinching on L. Discussed dry needling for next visit.    Eval:  Patient presents with primary complaint of pain and stiffness in neck. He has mild lack of ROM and increased pain with extension and rotation. He has increased muscle tension in Sub occipitals and poor, fwd head posture. He has lack of effective HEP for ongoing symptoms. Pt with decreased ability for full functional activities, reading and writing, due to pain.  Pt to benefit from skilled PT to improve deficits and pain.    OBJECTIVE IMPAIRMENTS: decreased activity tolerance, decreased ROM, decreased strength, hypomobility, impaired flexibility, impaired UE functional use, improper body mechanics, and pain.   ACTIVITY LIMITATIONS: lifting, sleeping, and locomotion level  PARTICIPATION LIMITATIONS: meal prep, cleaning, laundry, driving, shopping, community activity, and yard work  PERSONAL FACTORS: Past/current experiences and Time since onset of injury/illness/exacerbation are also affecting patient's  functional outcome.   REHAB POTENTIAL: Good  CLINICAL DECISION MAKING: Stable/uncomplicated  EVALUATION COMPLEXITY: Low  GOALS: Goals reviewed with patient? Yes   SHORT TERM GOALS: Target date: 01/18/2024   Pt to be independent with initial HEP  Goal status: INITIAL  2.  Pt to report decreased pain to 2/10 with reading/writing activities.   Goal status: INITIAL    LONG TERM GOALS: Target date: 02/22/2024   Pt to be independent with final HEP  Goal status: INITIAL  2.  Pt to demo improved neck ROM to be [facility] and pain free, to improve ability for ADLs.   Goal status: INITIAL  3.  Pt to report decreased pain in neck to 0-2/10 with reading/writing activity and in AM.   Goal status: INITIAL  4.  Pt to report overall improvement in symptoms by at least 75 %.   Goal status: INITIAL    PLAN: PT FREQUENCY: 1-2x/week  PT DURATION: 8 weeks  PLANNED INTERVENTIONS: Therapeutic exercises, Therapeutic activity, Neuromuscular re-education, Patient/Family education, Self Care, Joint mobilization, Joint manipulation, Stair training, DME instructions, Aquatic Therapy, Dry Needling, Electrical  stimulation, Cryotherapy, Moist heat, Taping, Ultrasound, Ionotophoresis 4mg /ml Dexamethasone , Manual therapy,  Vasopneumatic device, Traction, Spinal manipulation, Spinal mobilization,    PLAN FOR NEXT SESSION: Dry needling for L cervical paraspinals, multifidi, UT, continue manual for pain.   Tinnie Don, PT, DPT 11:37 AM  01/20/24

## 2024-01-23 ENCOUNTER — Ambulatory Visit: Admitting: Psychiatry

## 2024-01-23 DIAGNOSIS — F4323 Adjustment disorder with mixed anxiety and depressed mood: Secondary | ICD-10-CM

## 2024-01-23 DIAGNOSIS — F3340 Major depressive disorder, recurrent, in remission, unspecified: Secondary | ICD-10-CM | POA: Diagnosis not present

## 2024-01-23 DIAGNOSIS — Z630 Problems in relationship with spouse or partner: Secondary | ICD-10-CM

## 2024-01-23 NOTE — Progress Notes (Unsigned)
 Psychotherapy Progress Note [facility] Jodie Kendall, PhD LP  Patient ID: Jackson [name] [facility])    MRN: [id number] Therapy format: Individual psychotherapy Date: 01/23/2024      Start: 5:12p     Stop: 6:00p     Time Spent: 48 min Location: In-person   Session narrative (presenting needs, interim history, self-report of stressors and symptoms, applications of prior therapy, status changes, and interventions made in session) Interesting month.  Anniversary 8/13, rocky for months before that, but determined to provide for Jackson [name].  Got her gifts and an email acknowledging things have been rough but hopefully can receive in the spirit intended -- pleasantly surprised by softening attitude.  Still drinking, some less, but reliable positive attitude shift 3 wks, with no crabbing, guilting, or accusing.  Memory gaps, and unrealistic ideas of what she can pull off, but much reduced complaining.  Affirmed both Jackson [name] turn and how his choice to show unilateral kindness may have been important in bringing it about.  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  Supportive of the podcast, too.  Discussed natural reward in terms of things he is doing for her now, and overt thanks (he has).  At home, she still wants a spa put in, which is an all-but-nonstarter for him (too much responsibility, she's been very bad at living up to previous promises to maintain things).  New family reunion plans in the works.  Brother trying his latest get rich quick scheme, some concerns, plus he's suffering consequences of long hx drug abuse.  Substantial cardiac involvement, still smokes pot.  Support/validation provided.  Affirmed and encouraged in taking calls and helping organize further reunions.  Therapeutic modalities: Cognitive Behavioral Therapy, Solution-Oriented/Positive Psychology, Ego-Supportive, 12-Step, and Assertiveness/Communication  Mental  Status/Observations:  Appearance:   Casual     Behavior:  Appropriate  Motor:  Normal  Speech/Language:   Clear and Coherent  Affect:  Appropriate  Mood:  normal and cautiously optimistic  Thought process:  normal  Thought content:    WNL  Sensory/Perceptual disturbances:    WNL  Orientation:  Fully oriented  Attention:  Good    Concentration:  Good  Memory:  WNL  Insight:    Good  Judgment:   Good  Impulse Control:  Good   Risk Assessment: Danger to Self: No Self-injurious Behavior: No Danger to Others: No Physical Aggression / Violence: No Duty to Warn: No Access to Firearms a concern: No  Assessment of progress:  progressing  Diagnosis:   ICD-10-CM   1. Major depressive disorder, recurrent, in remission (HCC)  F33.40     2. Adjustment disorder with mixed anxiety and depressed mood  F43.23     3. Relationship problem between partners  Z63.0      Plan:  Conflict management -- Communication tactics for recruiting best chance of Jackson [name] hearing his concerns, backing off resentful diatribes.  Generally, hear her out, briefly, see if she can explain.  Make short work of defending anything, especially when it's just shotgun negativity he's responding to.  May try agreeing with an accusation, in principle (If that's what I'm doing and I was in your shoes, I'd feel the same way or something similar), and ask what she actually sees and needs at the moment, if only to force some exercise being direct, logical, and constructive.  Otherwise, certainly endorse taking unilateral timeout and getting physically out of reach if need be, best practice  being to let her know why he's leaving and approximately when and how he'll return.  Definitely don't try to argue when she is intoxicated, just use his own presence and reactions as reward or time out for her behavior, and let words and actions match.  As there are some signs of willingness to change from hostile dependency, seek to reward better  behavior with engagement, willingness to listen, and availability to healthier, more collaborative conflict.  Self-monitor for resentment and cynicism that would cancel or punish good-faith attempts to work it out better. 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  Referrals available when time is ripe.  If safety issue, be willing to call law enforcement to take Jackson [name] off the road.  Maintain standards about not enabling, i.e., not fetching alcohol for her and supporting Jackson [name]'s limits about child care.  Endorse his free choice about whether to remain together or threaten separation, either as a remedy for his stress or as intervention to motivate Jackson [name] to be treated.  Support -- Al-Anon offered for further moral support.  If not, self-affirm he is generally pursuing healthy boundaries and doing a good job of being stoic and grounded where needed.  Other groups of interest also recommended, e.g., a writers' group.  Maintain pleasant activities, including writing, caring for grandchildren, other pursuits.  Ideas for protecting time he wants to focus or take time out. Other recommendations/advice -- As may be noted above.  Continue to utilize previously learned skills ad lib. Medication compliance -- Maintain medication as prescribed and work faithfully with relevant prescriber(s) if any changes are desired or seem indicated. Crisis service -- Aware of call list and work-in appts.  Call the clinic on-call service, 988/hotline, 911, or present to [facility] or ER if any life-threatening psychiatric crisis. Followup -- Return for time as already scheduled.  Next scheduled visit with me 02/21/2024.  Next scheduled in this office 02/21/2024.  Jackson Kendall, PhD Jodie Kendall, PhD LP Clinical Psychologist, [facility] [address] [place], [place] [address] [phone]

## 2024-01-25 ENCOUNTER — Ambulatory Visit: Admitting: Physical Therapy

## 2024-01-25 ENCOUNTER — Encounter: Payer: Self-pay | Admitting: Physical Therapy

## 2024-01-25 DIAGNOSIS — M542 Cervicalgia: Secondary | ICD-10-CM

## 2024-01-25 NOTE — Therapy (Signed)
 OUTPATIENT PHYSICAL THERAPY UPPER EXTREMITY TREATMENT   Patient Name: Jackson [name] MRN: [id number] DOB:03-Apr-1956, 68 y.o., male Today's Date: 01/25/2024  END OF SESSION:  PT End of Session - 01/25/24 1058     Visit Number 5    Number of Visits 16    Date for PT Re-Evaluation 02/22/24    Authorization Type [employer] Medicare    PT Start Time 1020    PT Stop Time 1108    PT Time Calculation (min) 48 min    Activity Tolerance Patient tolerated treatment well    Behavior During Therapy WFL for tasks assessed/performed             Past Medical History:  Diagnosis Date   Anginal pain (HCC) 2019   heat stress. stress test was negative   Arthritis    Diverticulitis    Diverticulosis    HLD (hyperlipidemia)    Prostate cancer [facility])    Past Surgical History:  Procedure Laterality Date   CHOLECYSTECTOMY  05/18/2007   CYSTOSCOPY WITH STENT PLACEMENT N/A 03/28/2020   Procedure: FIREFLY INJECTION;  Surgeon: Cam Morene ORN, MD;  Location: [facility];  Service: Urology;  Laterality: N/A;   EYE SURGERY     PROSTATECTOMY  2011   REFRACTIVE SURGERY Bilateral    eyes   TONSILLECTOMY     Patient Active Problem List   Diagnosis Date Noted   Neck pain 12/26/2023   B12 deficiency 01/26/2022   Normocytic anemia 01/26/2022   Overweight (BMI 25.0-29.9) 10/28/2021   Prediabetes 08/20/2021   Actinic keratosis 09/12/2020   Diverticular disease s/p robotic sigmoidectomy 2021 03/28/2020   Dyslipidemia 08/01/2019   Prostate cancer (HCC) s/p prostatectomy 08/01/2019   Osteoarthritis 08/01/2019    PCP: Worth Kitty  REFERRING PROVIDER: Worth Kitty  REFERRING DIAG: Neck pain  THERAPY DIAG:  Cervicalgia  Rationale for Evaluation and Treatment: Rehabilitation  ONSET DATE:   SUBJECTIVE:                                                                                                                                                                                      SUBJECTIVE  STATEMENT: Pt states slightly less pain overall, but still feeling soreness on L.   Eval: New onset neck pain in last 6 months. No incident to report. He is Retired, but active, remodeling the house . Notes increased pain with activity, as well as stiffness after prolonged positioning, reading or sleeping. Stiff in AM. Likes to sleep on his back, got new pillow which he thinks is helping a bit.  Hand dominance: Right   PERTINENT HISTORY: Previous prostate CA,    PAIN:  Are you  having pain? Yes: NPRS scale: 2 /10 Pain location: neck , L, UT and R SO region  Pain description: stiff, sore  Aggravating factors: driving,  rotation and extension,  Relieving factors: none stated   PRECAUTIONS: None  RED FLAGS: None   WEIGHT BEARING RESTRICTIONS: No  FALLS:  Has patient fallen in last 6 months? No   PLOF: Independent  PATIENT GOALS: Decreased pain  NEXT MD VISIT:   OBJECTIVE:   DIAGNOSTIC FINDINGS:  FINDINGS: There is no evidence of cervical spine fracture or prevertebral soft tissue swelling. Alignment is normal. There is disc space narrowing at C5-C6 and C6-C7. Anterior osteophytes are seen throughout mid and lower cervical endplates. The bones are diffusely osteopenic. C1-C2 intervals within normal limits on the open-mouth view. Neural foramina appear patent bilaterally.   IMPRESSION: 1. No acute fracture or malalignment. 2. Degenerative changes of the cervical spine.    PATIENT SURVEYS :    COGNITION: Overall cognitive status: Within functional limits for tasks assessed     SENSATION: WFL  POSTURE:   UPPER EXTREMITY ROM:   Cervcial : flexion: WFL,  L rotation: 65,  R rotation: 60,  Extension: mild limitation (pain) UEs : WFL   UPPER EXTREMITY MMT:  Shoulders: 4+/5    SHOULDER SPECIAL TESTS:   JOINT MOBILITY TESTING:    PALPATION:  Tightness in bil UT s, mild tenderness in L sided parspinals, Tenderness in R>L sub occipitals  Mild  hypomobility for extension and R rotation     TODAY'S TREATMENT:                                                                                                                                         DATE:   01/25/2024  Therapeutic Exercise: Aerobic: Supine:  Quadruped:  chin tucks  2  x 10  Seated:   Standing:   Row Blue TB x 20;  Low Row Gtb x 20;  Shoulder ER  with RTB x 20;   shoulder flexion at wall, with attention to posture x 15;    Stretches:  Neuromuscular Re-education: Manual Therapy:   Cervical distraction; UT stretching bil; Cervical PA s ;  Therapeutic Activity: Modalities:  moist heat pack x 10 min to cervical region, at end of session.  Trigger Point Dry Needling  Initial Treatment: Pt instructed on Dry Needling rational, procedures, and possible side effects. Pt instructed to expect mild to moderate muscle soreness later in the day and/or into the next day.  Pt instructed in methods to reduce muscle soreness. Pt instructed to continue prescribed HEP. Because Dry Needling was performed over or adjacent to a lung field, pt was educated on S/S of pneumothorax and to seek immediate medical attention should they occur.  Patient was educated on signs and symptoms of infection and other risk factors and advised to seek medical attention should they occur.  Patient verbalized understanding of  these instructions and education.   Patient Verbal Consent Given: Yes Education Handout Provided: Yes Muscles Treated: L multifidi, L and R UT Electrical Stimulation Performed: No Treatment Response/Outcome: UT s: twitch response, multifidi: palpable increase in muscle length      Therapeutic Exercise: Aerobic: Supine:  Seated:  cervical flexion x 10,  Rotation to R with light overpressure - helpful x 15,  Rotation to L x 10 slight overpressure (improved tightness with repeated motions).  Standing:  Rows Blue TB x 20;   Low Row Gtb x 15;  Shoulder ER rom x 10,  with YTB x 15;  Doorway stretch 30 sec x 3  Stretches:  Neuromuscular Re-education: Manual Therapy:   Cervical distraction; UT stretching bil; Cervical PA s ; Cervical retraction for neutral spine, with rotation  Therapeutic Activity: Self Care:    Therapeutic Exercise: Aerobic: Supine: shoulder flexion/cane for t-spine mobility x 10;  Seated:  cervical flexion x 10,  Rotation to R- helpful x 15, Rotation to L sore on L.  Standing: Scap squeeze x 15,  Rows Blue TB x 20;   Doorway stretch 30 sec x 3 at 2 heights.  Stretches:  Neuromuscular Re-education: Manual Therapy:  STM L paraspinals, UT;  Cervical distraction; UT stretching bil; Cervical PA s ;  Therapeutic Activity: Self Care:   Eval:  Ther ex:See below for HEP Self care: education on optimal pillow height and position for decreasing pain while sleeping, pain management, use of mobility and heat, and posture for decreasing neck tension.   PATIENT EDUCATION:  Education details: updated and reviewed HEP.  Person educated: Patient Education method: Explanation, Demonstration, Tactile cues, Verbal cues, and Handouts Education comprehension: verbalized understanding, returned demonstration, verbal cues required, tactile cues required, and needs further education   HOME EXERCISE PROGRAM: Access Code: [email or web address] URL: [email or web address] Date: 12/28/2023 Prepared by: Tinnie Don  Exercises - Standing Backward Shoulder Rolls  - 2-3 x daily - 1 sets - 10 reps - Standing Scapular Retraction  - 2-3 x daily - 1 sets - 10 reps - Supine Cervical Rotation AROM on Pillow  - 2 x daily - 1 sets - 10 reps - Seated Correct Posture   ASSESSMENT:  CLINICAL IMPRESSION:  01/25/2024 Pt with good tolerance for activity and dry needling. Continued focus on muscle tension and pain on L side of neck, will assess response to dry needling next visit.   Eval:  Patient presents with primary complaint of pain and stiffness in neck. He has mild  lack of ROM and increased pain with extension and rotation. He has increased muscle tension in Sub occipitals and poor, fwd head posture. He has lack of effective HEP for ongoing symptoms. Pt with decreased ability for full functional activities, reading and writing, due to pain.  Pt to benefit from skilled PT to improve deficits and pain.    OBJECTIVE IMPAIRMENTS: decreased activity tolerance, decreased ROM, decreased strength, hypomobility, impaired flexibility, impaired UE functional use, improper body mechanics, and pain.   ACTIVITY LIMITATIONS: lifting, sleeping, and locomotion level  PARTICIPATION LIMITATIONS: meal prep, cleaning, laundry, driving, shopping, community activity, and yard work  PERSONAL FACTORS: Past/current experiences and Time since onset of injury/illness/exacerbation are also affecting patient's functional outcome.   REHAB POTENTIAL: Good  CLINICAL DECISION MAKING: Stable/uncomplicated  EVALUATION COMPLEXITY: Low  GOALS: Goals reviewed with patient? Yes   SHORT TERM GOALS: Target date: 01/18/2024   Pt to be independent with initial HEP  Goal status: MET  2.  Pt to report  decreased pain to 2/10 with reading/writing activities.   Goal status: MET    LONG TERM GOALS: Target date: 02/22/2024   Pt to be independent with final HEP  Goal status: INITIAL  2.  Pt to demo improved neck ROM to be [facility] and pain free, to improve ability for ADLs.   Goal status: INITIAL  3.  Pt to report decreased pain in neck to 0-2/10 with reading/writing activity and in AM.   Goal status: INITIAL  4.  Pt to report overall improvement in symptoms by at least 75 %.   Goal status: INITIAL    PLAN: PT FREQUENCY: 1-2x/week  PT DURATION: 8 weeks  PLANNED INTERVENTIONS: Therapeutic exercises, Therapeutic activity, Neuromuscular re-education, Patient/Family education, Self Care, Joint mobilization, Joint manipulation, Stair training, DME instructions, Aquatic Therapy, Dry  Needling, Electrical stimulation, Cryotherapy, Moist heat, Taping, Ultrasound, Ionotophoresis 4mg /ml Dexamethasone , Manual therapy,  Vasopneumatic device, Traction, Spinal manipulation, Spinal mobilization,    PLAN FOR NEXT SESSION:  Dry needling for L cervical paraspinals, multifidi, UT, continue manual for pain.   Tinnie Don, PT, DPT 11:05 AM  01/25/24

## 2024-01-30 ENCOUNTER — Ambulatory Visit: Admitting: Physical Therapy

## 2024-01-30 ENCOUNTER — Encounter: Payer: Self-pay | Admitting: Physical Therapy

## 2024-01-30 DIAGNOSIS — M542 Cervicalgia: Secondary | ICD-10-CM

## 2024-01-30 NOTE — Therapy (Signed)
 OUTPATIENT PHYSICAL THERAPY UPPER EXTREMITY TREATMENT   Patient Name: Jackson [name] MRN: [id number] DOB:1955-09-09, 68 y.o., male Today's Date: 01/30/2024  END OF SESSION:  PT End of Session - 01/30/24 1245     Visit Number 6    Number of Visits 16    Date for PT Re-Evaluation 02/22/24    Authorization Type [employer] Medicare    PT Start Time 0846    PT Stop Time 0926    PT Time Calculation (min) 40 min    Activity Tolerance Patient tolerated treatment well    Behavior During Therapy [facility] for tasks assessed/performed              Past Medical History:  Diagnosis Date   Anginal pain (HCC) 2019   heat stress. stress test was negative   Arthritis    Diverticulitis    Diverticulosis    HLD (hyperlipidemia)    Prostate cancer [facility])    Past Surgical History:  Procedure Laterality Date   CHOLECYSTECTOMY  05/18/2007   CYSTOSCOPY WITH STENT PLACEMENT N/A 03/28/2020   Procedure: FIREFLY INJECTION;  Surgeon: Cam Morene ORN, MD;  Location: [facility];  Service: Urology;  Laterality: N/A;   EYE SURGERY     PROSTATECTOMY  2011   REFRACTIVE SURGERY Bilateral    eyes   TONSILLECTOMY     Patient Active Problem List   Diagnosis Date Noted   Neck pain 12/26/2023   B12 deficiency 01/26/2022   Normocytic anemia 01/26/2022   Overweight (BMI 25.0-29.9) 10/28/2021   Prediabetes 08/20/2021   Actinic keratosis 09/12/2020   Diverticular disease s/p robotic sigmoidectomy 2021 03/28/2020   Dyslipidemia 08/01/2019   Prostate cancer (HCC) s/p prostatectomy 08/01/2019   Osteoarthritis 08/01/2019    PCP: Worth Kitty  REFERRING PROVIDER: Worth Kitty  REFERRING DIAG: Neck pain  THERAPY DIAG:  Cervicalgia  Rationale for Evaluation and Treatment: Rehabilitation  ONSET DATE:   SUBJECTIVE:                                                                                                                                                                                      SUBJECTIVE  STATEMENT: Pt states slightly less pain overall, but still feeling soreness on L. Thinks dry needling was helpful.   Eval: New onset neck pain in last 6 months. No incident to report. He is Retired, but active, remodeling the house . Notes increased pain with activity, as well as stiffness after prolonged positioning, reading or sleeping. Stiff in AM. Likes to sleep on his back, got new pillow which he thinks is helping a bit.  Hand dominance: Right   PERTINENT HISTORY: Previous prostate CA,  PAIN:  Are you having pain? Yes: NPRS scale: 2 /10 Pain location: neck , L, UT and R SO region  Pain description: stiff, sore  Aggravating factors: driving,  rotation and extension,  Relieving factors: none stated   PRECAUTIONS: None  RED FLAGS: None   WEIGHT BEARING RESTRICTIONS: No  FALLS:  Has patient fallen in last 6 months? No   PLOF: Independent  PATIENT GOALS: Decreased pain  NEXT MD VISIT:   OBJECTIVE:   DIAGNOSTIC FINDINGS:  FINDINGS: There is no evidence of cervical spine fracture or prevertebral soft tissue swelling. Alignment is normal. There is disc space narrowing at C5-C6 and C6-C7. Anterior osteophytes are seen throughout mid and lower cervical endplates. The bones are diffusely osteopenic. C1-C2 intervals within normal limits on the open-mouth view. Neural foramina appear patent bilaterally.   IMPRESSION: 1. No acute fracture or malalignment. 2. Degenerative changes of the cervical spine.    PATIENT SURVEYS :    COGNITION: Overall cognitive status: Within functional limits for tasks assessed     SENSATION: WFL  POSTURE:   UPPER EXTREMITY ROM:   Cervcial : flexion: WFL,  L rotation: 65,  R rotation: 60,  Extension: mild limitation (pain) UEs : WFL   UPPER EXTREMITY MMT:  Shoulders: 4+/5    SHOULDER SPECIAL TESTS:   JOINT MOBILITY TESTING:    PALPATION:  Tightness in bil UT s, mild tenderness in L sided parspinals, Tenderness in  R>L sub occipitals  Mild hypomobility for extension and R rotation     TODAY'S TREATMENT:                                                                                                                                         DATE:   01/30/2024 Therapeutic Exercise: Aerobic: Supine:  Quadruped:   Seated:   Standing:   Low Row Gtb x 20;  Shoulder ER  with RTB x 20;   shoulder flexion at wall, with attention to posture x 15;   chin tucks at wall x 10;  Stretches:  Neuromuscular Re-education: Manual Therapy:   Cervical distraction; UT stretching bil; Cervical PA s ; STM/DTM to L paraspinals,  Therapeutic Activity: Modalities:   Trigger Point Dry Needling    Therapeutic Exercise: Aerobic: Supine:  Quadruped:  chin tucks  2  x 10  Seated:   Standing:   Row Blue TB x 20;  Low Row Gtb x 20;  Shoulder ER  with RTB x 20;   shoulder flexion at wall, with attention to posture x 15;    Stretches:  Neuromuscular Re-education: Manual Therapy:   Cervical distraction; UT stretching bil; Cervical PA s ;  Therapeutic Activity: Modalities:  moist heat pack x 10 min to cervical region, at end of session.  Trigger Point Dry Needling  Initial Treatment: Pt instructed on Dry Needling rational, procedures, and possible side effects. Pt instructed to expect  mild to moderate muscle soreness later in the day and/or into the next day.  Pt instructed in methods to reduce muscle soreness. Pt instructed to continue prescribed HEP. Because Dry Needling was performed over or adjacent to a lung field, pt was educated on S/S of pneumothorax and to seek immediate medical attention should they occur.  Patient was educated on signs and symptoms of infection and other risk factors and advised to seek medical attention should they occur.  Patient verbalized understanding of these instructions and education.   Patient Verbal Consent Given: Yes Education Handout Provided: Yes Muscles Treated: L multifidi, L and R  UT Electrical Stimulation Performed: No Treatment Response/Outcome: UT s: twitch response, multifidi: palpable increase in muscle length      Therapeutic Exercise: Aerobic: Supine:  Seated:  cervical flexion x 10,  Rotation to R with light overpressure - helpful x 15,  Rotation to L x 10 slight overpressure (improved tightness with repeated motions).  Standing:  Rows Blue TB x 20;   Low Row Gtb x 15;  Shoulder ER rom x 10,  with YTB x 15; Doorway stretch 30 sec x 3  Stretches:  Neuromuscular Re-education: Manual Therapy:   Cervical distraction; UT stretching bil; Cervical PA s ; Cervical retraction for neutral spine, with rotation  Therapeutic Activity: Self Care:    Therapeutic Exercise: Aerobic: Supine: shoulder flexion/cane for t-spine mobility x 10;  Seated:  cervical flexion x 10,  Rotation to R- helpful x 15, Rotation to L sore on L.  Standing: Scap squeeze x 15,  Rows Blue TB x 20;   Doorway stretch 30 sec x 3 at 2 heights.  Stretches:  Neuromuscular Re-education: Manual Therapy:  STM L paraspinals, UT;  Cervical distraction; UT stretching bil; Cervical PA s ;  Therapeutic Activity: Self Care:   Eval:  Ther ex:See below for HEP Self care: education on optimal pillow height and position for decreasing pain while sleeping, pain management, use of mobility and heat, and posture for decreasing neck tension.   PATIENT EDUCATION:  Education details: updated and reviewed HEP.  Person educated: Patient Education method: Explanation, Demonstration, Tactile cues, Verbal cues, and Handouts Education comprehension: verbalized understanding, returned demonstration, verbal cues required, tactile cues required, and needs further education   HOME EXERCISE PROGRAM: Access Code: [email or web address] URL: [email or web address] Date: 12/28/2023 Prepared by: Tinnie Don  Exercises - Standing Backward Shoulder Rolls  - 2-3 x daily - 1 sets - 10 reps - Standing Scapular  Retraction  - 2-3 x daily - 1 sets - 10 reps - Supine Cervical Rotation AROM on Pillow  - 2 x daily - 1 sets - 10 reps - Seated Correct Posture   ASSESSMENT:  CLINICAL IMPRESSION:  01/30/2024 Pt with good tolerance for manual. He has mild improvements in ability and rom for rotation, but still getting some pinching with full/end range left rotation, however improving from previous visits. Pt to benefit from continued care, with focus on manual for L sided pain.   Eval:  Patient presents with primary complaint of pain and stiffness in neck. He has mild lack of ROM and increased pain with extension and rotation. He has increased muscle tension in Sub occipitals and poor, fwd head posture. He has lack of effective HEP for ongoing symptoms. Pt with decreased ability for full functional activities, reading and writing, due to pain.  Pt to benefit from skilled PT to improve deficits and pain.    OBJECTIVE IMPAIRMENTS: decreased activity tolerance, decreased ROM, decreased  strength, hypomobility, impaired flexibility, impaired UE functional use, improper body mechanics, and pain.   ACTIVITY LIMITATIONS: lifting, sleeping, and locomotion level  PARTICIPATION LIMITATIONS: meal prep, cleaning, laundry, driving, shopping, community activity, and yard work  PERSONAL FACTORS: Past/current experiences and Time since onset of injury/illness/exacerbation are also affecting patient's functional outcome.   REHAB POTENTIAL: Good  CLINICAL DECISION MAKING: Stable/uncomplicated  EVALUATION COMPLEXITY: Low  GOALS: Goals reviewed with patient? Yes   SHORT TERM GOALS: Target date: 01/18/2024   Pt to be independent with initial HEP  Goal status: MET  2.  Pt to report decreased pain to 2/10 with reading/writing activities.   Goal status: MET    LONG TERM GOALS: Target date: 02/22/2024   Pt to be independent with final HEP  Goal status: INITIAL  2.  Pt to demo improved neck ROM to be [facility] and  pain free, to improve ability for ADLs.   Goal status: INITIAL  3.  Pt to report decreased pain in neck to 0-2/10 with reading/writing activity and in AM.   Goal status: INITIAL  4.  Pt to report overall improvement in symptoms by at least 75 %.   Goal status: INITIAL    PLAN: PT FREQUENCY: 1-2x/week  PT DURATION: 8 weeks  PLANNED INTERVENTIONS: Therapeutic exercises, Therapeutic activity, Neuromuscular re-education, Patient/Family education, Self Care, Joint mobilization, Joint manipulation, Stair training, DME instructions, Aquatic Therapy, Dry Needling, Electrical stimulation, Cryotherapy, Moist heat, Taping, Ultrasound, Ionotophoresis 4mg /ml Dexamethasone , Manual therapy,  Vasopneumatic device, Traction, Spinal manipulation, Spinal mobilization,    PLAN FOR NEXT SESSION:  Dry needling for L cervical paraspinals, multifidi, UT, continue manual for pain.   Tinnie Don, PT, DPT 5:58 PM  01/30/24

## 2024-02-06 ENCOUNTER — Ambulatory Visit: Admitting: Physical Therapy

## 2024-02-06 ENCOUNTER — Encounter: Payer: Self-pay | Admitting: Physical Therapy

## 2024-02-06 DIAGNOSIS — M542 Cervicalgia: Secondary | ICD-10-CM

## 2024-02-06 NOTE — Therapy (Signed)
 OUTPATIENT PHYSICAL THERAPY UPPER EXTREMITY TREATMENT   Patient Name: Jackson [name] MRN: [id number] DOB:1955/11/13, 68 y.o., male Today's Date: 02/06/2024  END OF SESSION:  PT End of Session - 02/06/24 0845     Visit Number 7    Number of Visits 16    Date for Recertification  02/22/24    Authorization Type [employer] Medicare    PT Start Time 0847    PT Stop Time 0925    PT Time Calculation (min) 38 min    Activity Tolerance Patient tolerated treatment well    Behavior During Therapy [facility] for tasks assessed/performed              Past Medical History:  Diagnosis Date   Anginal pain (HCC) 2019   heat stress. stress test was negative   Arthritis    Diverticulitis    Diverticulosis    HLD (hyperlipidemia)    Prostate cancer [facility])    Past Surgical History:  Procedure Laterality Date   CHOLECYSTECTOMY  05/18/2007   CYSTOSCOPY WITH STENT PLACEMENT N/A 03/28/2020   Procedure: FIREFLY INJECTION;  Surgeon: Cam Morene ORN, MD;  Location: [facility];  Service: Urology;  Laterality: N/A;   EYE SURGERY     PROSTATECTOMY  2011   REFRACTIVE SURGERY Bilateral    eyes   TONSILLECTOMY     Patient Active Problem List   Diagnosis Date Noted   Neck pain 12/26/2023   B12 deficiency 01/26/2022   Normocytic anemia 01/26/2022   Overweight (BMI 25.0-29.9) 10/28/2021   Prediabetes 08/20/2021   Actinic keratosis 09/12/2020   Diverticular disease s/p robotic sigmoidectomy 2021 03/28/2020   Dyslipidemia 08/01/2019   Prostate cancer (HCC) s/p prostatectomy 08/01/2019   Osteoarthritis 08/01/2019    PCP: Worth Kitty  REFERRING PROVIDER: Worth Kitty  REFERRING DIAG: Neck pain  THERAPY DIAG:  Cervicalgia  Rationale for Evaluation and Treatment: Rehabilitation  ONSET DATE:   SUBJECTIVE:                                                                                                                                                                                      SUBJECTIVE  STATEMENT: Pt states slightly less pain overall. Still a bit sore with yard work. More sore at high cervical   Eval: New onset neck pain in last 6 months. No incident to report. He is Retired, but active, remodeling the house . Notes increased pain with activity, as well as stiffness after prolonged positioning, reading or sleeping. Stiff in AM. Likes to sleep on his back, got new pillow which he thinks is helping a bit.  Hand dominance: Right   PERTINENT HISTORY: Previous prostate CA,  PAIN:  Are you having pain? Yes: NPRS scale: 2 /10 Pain location: neck , L, UT and R SO region  Pain description: stiff, sore  Aggravating factors: driving,  rotation and extension,  Relieving factors: none stated   PRECAUTIONS: None  RED FLAGS: None   WEIGHT BEARING RESTRICTIONS: No  FALLS:  Has patient fallen in last 6 months? No   PLOF: Independent  PATIENT GOALS: Decreased pain  NEXT MD VISIT:   OBJECTIVE:   DIAGNOSTIC FINDINGS:  FINDINGS: There is no evidence of cervical spine fracture or prevertebral soft tissue swelling. Alignment is normal. There is disc space narrowing at C5-C6 and C6-C7. Anterior osteophytes are seen throughout mid and lower cervical endplates. The bones are diffusely osteopenic. C1-C2 intervals within normal limits on the open-mouth view. Neural foramina appear patent bilaterally.   IMPRESSION: 1. No acute fracture or malalignment. 2. Degenerative changes of the cervical spine.    PATIENT SURVEYS :    COGNITION: Overall cognitive status: Within functional limits for tasks assessed     SENSATION: WFL  POSTURE:   UPPER EXTREMITY ROM:   Cervcial : flexion: WFL,  L rotation: 65,  R rotation: 60,  Extension: mild limitation (pain) UEs : WFL   UPPER EXTREMITY MMT:  Shoulders: 4+/5    SHOULDER SPECIAL TESTS:   JOINT MOBILITY TESTING:    PALPATION:  Tightness in bil UT s, mild tenderness in L sided parspinals, Tenderness in R>L sub  occipitals  Mild hypomobility for extension and R rotation     TODAY'S TREATMENT:                                                                                                                                         DATE:   02/06/2024 Therapeutic Exercise: Aerobic: Supine:  Quadruped:   Seated:   Standing:    Stretches:  Neuromuscular Re-education: Manual Therapy:   Cervical distraction; UT stretching bil; Cervical PA s ; STM/DTM to L paraspinals and SO, SOR;  Therapeutic Activity: Low Row Gtb x 20;  Row Blue TB x 20;   shoulder flexion at wall, with attention to posture x 15;   chin tucks at wall x 10;  bent over row 10 lb x 10- unilateral,  with cueing for head and neck posture; chin tucks x 15  Modalities:   Trigger Point Dry Needling    Therapeutic Exercise: Aerobic: Supine:  Quadruped:  chin tucks  2  x 10  Seated:   Standing:   Row Blue TB x 20;  Low Row Gtb x 20;  Shoulder ER  with RTB x 20;   shoulder flexion at wall, with attention to posture x 15;    Stretches:  Neuromuscular Re-education: Manual Therapy:   Cervical distraction; UT stretching bil; Cervical PA s ;  Therapeutic Activity: Modalities:  moist heat pack x 10 min to cervical region, at end of session.  Trigger Point Dry Needling  Initial Treatment: Pt instructed on Dry Needling rational, procedures, and possible side effects. Pt instructed to expect mild to moderate muscle soreness later in the day and/or into the next day.  Pt instructed in methods to reduce muscle soreness. Pt instructed to continue prescribed HEP. Because Dry Needling was performed over or adjacent to a lung field, pt was educated on S/S of pneumothorax and to seek immediate medical attention should they occur.  Patient was educated on signs and symptoms of infection and other risk factors and advised to seek medical attention should they occur.  Patient verbalized understanding of these instructions and education.   Patient Verbal  Consent Given: Yes Education Handout Provided: Yes Muscles Treated: L multifidi, L and R UT Electrical Stimulation Performed: No Treatment Response/Outcome: UT s: twitch response, multifidi: palpable increase in muscle length      Therapeutic Exercise: Aerobic: Supine:  Seated:  cervical flexion x 10,  Rotation to R with light overpressure - helpful x 15,  Rotation to L x 10 slight overpressure (improved tightness with repeated motions).  Standing:  Rows Blue TB x 20;   Low Row Gtb x 15;  Shoulder ER rom x 10,  with YTB x 15; Doorway stretch 30 sec x 3  Stretches:  Neuromuscular Re-education: Manual Therapy:   Cervical distraction; UT stretching bil; Cervical PA s ; Cervical retraction for neutral spine, with rotation  Therapeutic Activity: Self Care:    Therapeutic Exercise: Aerobic: Supine: shoulder flexion/cane for t-spine mobility x 10;  Seated:  cervical flexion x 10,  Rotation to R- helpful x 15, Rotation to L sore on L.  Standing: Scap squeeze x 15,  Rows Blue TB x 20;   Doorway stretch 30 sec x 3 at 2 heights.  Stretches:  Neuromuscular Re-education: Manual Therapy:  STM L paraspinals, UT;  Cervical distraction; UT stretching bil; Cervical PA s ;  Therapeutic Activity: Self Care:   Eval:  Ther ex:See below for HEP Self care: education on optimal pillow height and position for decreasing pain while sleeping, pain management, use of mobility and heat, and posture for decreasing neck tension.   PATIENT EDUCATION:  Education details: updated and reviewed HEP.  Person educated: Patient Education method: Explanation, Demonstration, Tactile cues, Verbal cues, and Handouts Education comprehension: verbalized understanding, returned demonstration, verbal cues required, tactile cues required, and needs further education   HOME EXERCISE PROGRAM: Access Code: [email or web address] URL: [email or web address] Date: 12/28/2023 Prepared by: Tinnie Don  Exercises -  Standing Backward Shoulder Rolls  - 2-3 x daily - 1 sets - 10 reps - Standing Scapular Retraction  - 2-3 x daily - 1 sets - 10 reps - Supine Cervical Rotation AROM on Pillow  - 2 x daily - 1 sets - 10 reps - Seated Correct Posture   ASSESSMENT:  CLINICAL IMPRESSION:  02/06/2024 Pt with more soreness in higher cervical/so region today on L. Continued manual for relief due to previous positive response from this. Pt continues to report decreased pain levels, but has mild pain on L that continues to be bothersome. Pt to benefit from continued care.   Eval:  Patient presents with primary complaint of pain and stiffness in neck. He has mild lack of ROM and increased pain with extension and rotation. He has increased muscle tension in Sub occipitals and poor, fwd head posture. He has lack of effective HEP for ongoing symptoms. Pt with decreased ability for full functional activities, reading and writing, due to pain.  Pt to benefit from skilled PT to improve deficits and pain.    OBJECTIVE IMPAIRMENTS: decreased activity tolerance, decreased ROM, decreased strength, hypomobility, impaired flexibility, impaired UE functional use, improper body mechanics, and pain.   ACTIVITY LIMITATIONS: lifting, sleeping, and locomotion level  PARTICIPATION LIMITATIONS: meal prep, cleaning, laundry, driving, shopping, community activity, and yard work  PERSONAL FACTORS: Past/current experiences and Time since onset of injury/illness/exacerbation are also affecting patient's functional outcome.   REHAB POTENTIAL: Good  CLINICAL DECISION MAKING: Stable/uncomplicated  EVALUATION COMPLEXITY: Low  GOALS: Goals reviewed with patient? Yes   SHORT TERM GOALS: Target date: 01/18/2024   Pt to be independent with initial HEP  Goal status: MET  2.  Pt to report decreased pain to 2/10 with reading/writing activities.   Goal status: MET    LONG TERM GOALS: Target date: 02/22/2024   Pt to be independent with  final HEP  Goal status: INITIAL  2.  Pt to demo improved neck ROM to be [facility] and pain free, to improve ability for ADLs.   Goal status: INITIAL  3.  Pt to report decreased pain in neck to 0-2/10 with reading/writing activity and in AM.   Goal status: INITIAL  4.  Pt to report overall improvement in symptoms by at least 75 %.   Goal status: INITIAL    PLAN: PT FREQUENCY: 1-2x/week  PT DURATION: 8 weeks  PLANNED INTERVENTIONS: Therapeutic exercises, Therapeutic activity, Neuromuscular re-education, Patient/Family education, Self Care, Joint mobilization, Joint manipulation, Stair training, DME instructions, Aquatic Therapy, Dry Needling, Electrical stimulation, Cryotherapy, Moist heat, Taping, Ultrasound, Ionotophoresis 4mg /ml Dexamethasone , Manual therapy,  Vasopneumatic device, Traction, Spinal manipulation, Spinal mobilization,    PLAN FOR NEXT SESSION:  Dry needling for L cervical paraspinals, multifidi, UT, continue manual for pain.   Tinnie Don, PT, DPT 8:52 AM  02/06/24

## 2024-02-13 ENCOUNTER — Encounter: Payer: Self-pay | Admitting: Physical Therapy

## 2024-02-13 ENCOUNTER — Ambulatory Visit: Admitting: Physical Therapy

## 2024-02-13 DIAGNOSIS — M542 Cervicalgia: Secondary | ICD-10-CM

## 2024-02-13 NOTE — Therapy (Signed)
 OUTPATIENT PHYSICAL THERAPY UPPER EXTREMITY TREATMENT   Patient Name: Jackson [name] MRN: [id number] DOB:1955-05-24, 68 y.o., male Today's Date: 02/13/2024  END OF SESSION:  PT End of Session - 02/13/24 1139     Visit Number 8    Number of Visits 16    Date for Recertification  02/22/24    Authorization Type [employer] Medicare  10 v through 11/5    PT Start Time 0847    PT Stop Time 0930    PT Time Calculation (min) 43 min    Activity Tolerance Patient tolerated treatment well    Behavior During Therapy [facility] for tasks assessed/performed               Past Medical History:  Diagnosis Date   Anginal pain 2019   heat stress. stress test was negative   Arthritis    Diverticulitis    Diverticulosis    HLD (hyperlipidemia)    Prostate cancer [facility])    Past Surgical History:  Procedure Laterality Date   CHOLECYSTECTOMY  05/18/2007   CYSTOSCOPY WITH STENT PLACEMENT N/A 03/28/2020   Procedure: FIREFLY INJECTION;  Surgeon: Cam Morene ORN, MD;  Location: [facility];  Service: Urology;  Laterality: N/A;   EYE SURGERY     PROSTATECTOMY  2011   REFRACTIVE SURGERY Bilateral    eyes   TONSILLECTOMY     Patient Active Problem List   Diagnosis Date Noted   Neck pain 12/26/2023   B12 deficiency 01/26/2022   Normocytic anemia 01/26/2022   Overweight (BMI 25.0-29.9) 10/28/2021   Prediabetes 08/20/2021   Actinic keratosis 09/12/2020   Diverticular disease s/p robotic sigmoidectomy 2021 03/28/2020   Dyslipidemia 08/01/2019   Prostate cancer (HCC) s/p prostatectomy 08/01/2019   Osteoarthritis 08/01/2019    PCP: Worth Kitty  REFERRING PROVIDER: Worth Kitty  REFERRING DIAG: Neck pain  THERAPY DIAG:  Cervicalgia  Rationale for Evaluation and Treatment: Rehabilitation  ONSET DATE:   SUBJECTIVE:                                                                                                                                                                                       SUBJECTIVE STATEMENT: Pt states slightly less pain overall, feels he is doing very well. He does have slight pain with longer standing or sitting, this weekend was a book fair. He is having quite a bit of pain free time.   Eval: New onset neck pain in last 6 months. No incident to report. He is Retired, but active, remodeling the house . Notes increased pain with activity, as well as stiffness after prolonged positioning, reading or sleeping. Stiff in AM. Likes to  sleep on his back, got new pillow which he thinks is helping a bit.  Hand dominance: Right   PERTINENT HISTORY: Previous prostate CA,    PAIN:  Are you having pain? Yes: NPRS scale: 0- 2 /10 Pain location: neck , L, UT and R SO region  Pain description: stiff, sore  Aggravating factors: driving,  rotation and extension,  Relieving factors: none stated   PRECAUTIONS: None  RED FLAGS: None   WEIGHT BEARING RESTRICTIONS: No  FALLS:  Has patient fallen in last 6 months? No   PLOF: Independent  PATIENT GOALS: Decreased pain  NEXT MD VISIT:   OBJECTIVE:   DIAGNOSTIC FINDINGS:  FINDINGS: There is no evidence of cervical spine fracture or prevertebral soft tissue swelling. Alignment is normal. There is disc space narrowing at C5-C6 and C6-C7. Anterior osteophytes are seen throughout mid and lower cervical endplates. The bones are diffusely osteopenic. C1-C2 intervals within normal limits on the open-mouth view. Neural foramina appear patent bilaterally.   IMPRESSION: 1. No acute fracture or malalignment. 2. Degenerative changes of the cervical spine.    PATIENT SURVEYS :    COGNITION: Overall cognitive status: Within functional limits for tasks assessed     SENSATION: WFL  POSTURE:   UPPER EXTREMITY ROM:   Cervcial : flexion: WFL,  L rotation: 65,  R rotation: 60,  Extension: mild limitation (pain) UEs : WFL   UPPER EXTREMITY MMT:  Shoulders: 4+/5    SHOULDER SPECIAL TESTS:   JOINT  MOBILITY TESTING:    PALPATION:  Tightness in bil UT s, mild tenderness in L sided parspinals, Tenderness in R>L sub occipitals  Mild hypomobility for extension and R rotation     TODAY'S TREATMENT:                                                                                                                                         DATE:   02/13/2024 Therapeutic Exercise:  updated and reviewed Final HEP Aerobic: Supine:  Quadruped:   cat/cow for thoracic mobility x 12;  Seated:   Standing:    Stretches:  Neuromuscular Re-education: Manual Therapy:   Cervical distraction; UT stretching bil; Cervical PA s ; STM/DTM to L paraspinals ,  Therapeutic Activity: Low Row Gtb x 20; shoulder flexion at wall, with attention to posture x 10 ;  chin tucks x 10;  Shoulder ER x 10 RTB ,  Scap pull aparts x 10 RTB ;  Modalities:   Trigger Point Dry Needling    PATIENT EDUCATION:  Education details: updated and reviewed HEP.  Person educated: Patient Education method: Explanation, Demonstration, Tactile cues, Verbal cues, and Handouts Education comprehension: verbalized understanding, returned demonstration, verbal cues required, tactile cues required, and needs further education   HOME EXERCISE PROGRAM: Access Code: [email or web address] using app   ASSESSMENT:  CLINICAL IMPRESSION:  02/13/2024 Pt  overall making good progress, with much pain  free time. He still has some soreness with longer duration positioning in standing or sitting, and mild soreness with full /end range L rotation. He has met most goals, and is currently happy with progress. He has much improved postural awareness. He is ready for d/c to HEP. Final HEP reviewed today. Pt in agreement with plan. We also discussed gettting appt with sports med, if pain returns in next few months.   Eval:  Patient presents with primary complaint of pain and stiffness in neck. He has mild lack of ROM and increased pain with extension and rotation. He  has increased muscle tension in Sub occipitals and poor, fwd head posture. He has lack of effective HEP for ongoing symptoms. Pt with decreased ability for full functional activities, reading and writing, due to pain.  Pt to benefit from skilled PT to improve deficits and pain.    OBJECTIVE IMPAIRMENTS: decreased activity tolerance, decreased ROM, decreased strength, hypomobility, impaired flexibility, impaired UE functional use, improper body mechanics, and pain.   ACTIVITY LIMITATIONS: lifting, sleeping, and locomotion level  PARTICIPATION LIMITATIONS: meal prep, cleaning, laundry, driving, shopping, community activity, and yard work  PERSONAL FACTORS: Past/current experiences and Time since onset of injury/illness/exacerbation are also affecting patient's functional outcome.   REHAB POTENTIAL: Good  CLINICAL DECISION MAKING: Stable/uncomplicated  EVALUATION COMPLEXITY: Low  GOALS: Goals reviewed with patient? Yes   SHORT TERM GOALS: Target date: 01/18/2024   Pt to be independent with initial HEP  Goal status: MET  2.  Pt to report decreased pain to 2/10 with reading/writing activities.   Goal status: MET    LONG TERM GOALS: Target date: 02/22/2024   Pt to be independent with final HEP  Goal status: MET  2.  Pt to demo improved neck ROM to be [facility] and pain free, to improve ability for ADLs.   Goal status: MET-- (mild soreness at end range L rotation )   3.  Pt to report decreased pain in neck to 0-2/10 with reading/writing activity and in AM.   Goal status: MET  4.  Pt to report overall improvement in symptoms by at least 75 %.   Goal status: MET    PLAN: PT FREQUENCY: 1-2x/week  PT DURATION: 8 weeks  PLANNED INTERVENTIONS: Therapeutic exercises, Therapeutic activity, Neuromuscular re-education, Patient/Family education, Self Care, Joint mobilization, Joint manipulation, Stair training, DME instructions, Aquatic Therapy, Dry Needling, Electrical stimulation,  Cryotherapy, Moist heat, Taping, Ultrasound, Ionotophoresis 4mg /ml Dexamethasone , Manual therapy,  Vasopneumatic device, Traction, Spinal manipulation, Spinal mobilization,    PLAN FOR NEXT SESSION:      Tinnie Don, PT, DPT 11:44 AM  02/13/24   PHYSICAL THERAPY DISCHARGE SUMMARY  Visits from Start of Care: 8   Plan: Patient agrees to discharge.  Patient goals were met. Patient is being discharged due to meeting the stated rehab goals.     Tinnie Don, PT, DPT 11:50 AM  02/13/24

## 2024-02-20 ENCOUNTER — Encounter: Admitting: Physical Therapy

## 2024-02-21 ENCOUNTER — Ambulatory Visit: Admitting: Psychiatry

## 2024-02-21 DIAGNOSIS — F3340 Major depressive disorder, recurrent, in remission, unspecified: Secondary | ICD-10-CM

## 2024-02-21 DIAGNOSIS — F4323 Adjustment disorder with mixed anxiety and depressed mood: Secondary | ICD-10-CM | POA: Diagnosis not present

## 2024-02-21 DIAGNOSIS — Z630 Problems in relationship with spouse or partner: Secondary | ICD-10-CM | POA: Diagnosis not present

## 2024-02-21 NOTE — Progress Notes (Unsigned)
 Psychotherapy Progress Note [facility] Jodie Kendall, PhD LP  Patient ID: Andrik Sandt [facility])    MRN: [id number] Therapy format: Individual psychotherapy Date: 02/21/2024      Start: 9:18a     Stop: 10:06a     Time Spent: 48 min Location: In-person   Session narrative (presenting needs, interim history, self-report of stressors and symptoms, applications of prior therapy, status changes, and interventions made in session) [name] was doing better for a while, was going to let her come along on a book fair, but she watched the boys.  Venue turned out to be uncomfortable with incomplete HVAC and long day on his feet, and he was largely exhausted, decided not to try to call her before he left or on the way home, which turned out to be a trigger for her to become irate when he got home.  Another attempt to pick a fight about money after she found less money available than she expected (already spent by her on other things).  Has found her still more reasonable than expectable about publishing costs, even if her eyes get bigger than her wallet about landscaping, dental work, other items.  One evening she was drunk and in a bad mood, came out to the garage first time in a while, crabbing at him while he was in a podcast.  Decided to install a latch on the door, which she predictably discovered in a few days and blew up over.  Also another round of   Further concern that [name] is having memory blackouts, like having broken a candleholder and not remembered it, accused him.  Frequent trouble finding her phone.  She doesn't go out enough to get lost, but can see it coming.  Considering talking with sD [name] to compare notes about her memory.  Has seen [name] balk at their insurance's request to have a physician home visit.  Knows she is resisting getting physical, anyway, seeming ly trying to avoid finding out she's messed up her health.  Chronic cough, raising the specter of lung cancer, or  maybe a less serious something, or [employer] water  exposure, even, with her family history.  Support/validation provided, encouraged in becoming more ready to ask [name] what it takes for her to decide for herself that she's had enough of how her drinking sickens her.  Meanwhile, well worth checking in with step D to align comprehension of her mother's condition and standards for intervening more assertively with her.  Can rehearse together if needed.  Therapeutic modalities: Cognitive Behavioral Therapy, Solution-Oriented/Positive Psychology, Environmental manager, and 12-Step  Mental Status/Observations: Appearance:   Casual     Behavior:  Appropriate  Motor:  Normal  Speech/Language:   Clear and Coherent  Affect:  Appropriate  Mood:  More wearied, stoic  Thought process:  normal  Thought content:    WNL  Sensory/Perceptual disturbances:    WNL  Orientation:  Fully oriented  Attention:  Good    Concentration:  Good  Memory:  WNL  Insight:    Good  Judgment:   Good  Impulse Control:  Good   Risk Assessment: Danger to Self: No Self-injurious Behavior: No Danger to Others: No Physical Aggression / Violence: No Duty to Warn: No Access to Firearms a concern: No  Assessment of progress:  progressing  Diagnosis:   ICD-10-CM   1. Major depressive disorder, recurrent, in remission  F33.40     2. Adjustment disorder with mixed anxiety and depressed mood  F43.23  3. Relationship problem between partners  Z63.0      Plan:  Conflict management -- Communication tactics for recruiting best chance of [name] hearing his concerns, backing off resentful diatribes.  Generally, hear her out, briefly, see if she can explain.  Make short work of defending anything, especially when it's just shotgun negativity he's responding to.  May try agreeing with an accusation, in principle (If that's what I'm doing and I was in your shoes, I'd feel the same way or something similar), and ask what she actually  sees and needs at the moment, if only to force some exercise being direct, logical, and constructive.  Otherwise, certainly endorse taking unilateral timeout and getting physically out of reach if need be, best practice being to let her know why he's leaving and approximately when and how he'll return.  Definitely don't try to argue when she is intoxicated, just use his own presence and reactions as reward or time out for her behavior, and let words and actions match.  As there are some signs of willingness to change from hostile dependency, seek to reward better behavior with engagement, willingness to listen, and availability to healthier, more collaborative conflict.  Self-monitor for resentment and cynicism that would cancel or punish good-faith attempts to work it out better. 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  Referrals available when time is ripe.  If safety issue, be willing to call law enforcement to take [name] off the road.  Maintain standards about not enabling, i.e., not fetching alcohol for her and supporting [name] limits about child care.  Endorse his free choice about whether to remain together or threaten separation, either as a remedy for his stress or as intervention to motivate [name] to be treated.  Support -- Al-Anon offered for further moral support.  If not, self-affirm he is generally pursuing healthy boundaries and doing a good job of being stoic and grounded where needed.  Other groups of interest also recommended, e.g., a writers' group.  Maintain pleasant activities, including writing, caring for grandchildren, other pursuits.  Ideas for protecting time he wants to focus or take time out. Other recommendations/advice -- As may be noted above.  Continue to utilize previously learned skills ad lib. Medication compliance -- Maintain medication as prescribed and work faithfully with relevant  prescriber(s) if any changes are desired or seem indicated. Crisis service -- Aware of call list and work-in appts.  Call the clinic on-call service, 988/hotline, 911, or present to [facility] or ER if any life-threatening psychiatric crisis. Followup -- Return for time as already scheduled.  Next scheduled visit with me 03/21/2024.  Next scheduled in this office 03/21/2024.  Lamar Kendall, PhD Jodie Kendall, PhD LP Clinical Psychologist, [facility] [address], [address] [place], [place] [address] [phone]

## 2024-03-21 ENCOUNTER — Ambulatory Visit: Admitting: Psychiatry

## 2024-03-21 DIAGNOSIS — Z630 Problems in relationship with spouse or partner: Secondary | ICD-10-CM

## 2024-03-21 DIAGNOSIS — F3340 Major depressive disorder, recurrent, in remission, unspecified: Secondary | ICD-10-CM

## 2024-03-21 DIAGNOSIS — F4323 Adjustment disorder with mixed anxiety and depressed mood: Secondary | ICD-10-CM | POA: Diagnosis not present
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
## 2024-03-26 ENCOUNTER — Encounter: Payer: Self-pay | Admitting: Family Medicine

## 2024-03-26 ENCOUNTER — Ambulatory Visit: Admitting: Family Medicine

## 2024-03-26 VITALS — BP 110/62 | HR 54 | Temp 98.0°F | Ht 70.0 in | Wt 173.6 lb

## 2024-03-26 DIAGNOSIS — E7251 Non-ketotic hyperglycinemia: Secondary | ICD-10-CM | POA: Diagnosis not present

## 2024-03-26 DIAGNOSIS — R7303 Prediabetes: Secondary | ICD-10-CM | POA: Diagnosis not present

## 2024-03-26 DIAGNOSIS — M542 Cervicalgia: Secondary | ICD-10-CM | POA: Diagnosis not present

## 2024-03-26 LAB — POCT GLYCOSYLATED HEMOGLOBIN (HGB A1C): Hemoglobin A1C: 5.7 % — AB (ref 4.0–5.6)

## 2024-03-26 MED ORDER — AZITHROMYCIN 250 MG PO TABS
ORAL_TABLET | ORAL | 0 refills | Status: AC
Start: 2024-03-26 — End: ?

## 2024-03-26 MED ORDER — AZELASTINE HCL 0.1 % NA SOLN: 2.0000 | Freq: Two times a day (BID) | NASAL | 12 refills | Status: AC

## 2024-03-26 NOTE — Assessment & Plan Note (Signed)
 Doing much better after recent course of physical therapy.

## 2024-03-26 NOTE — Progress Notes (Signed)
   Valentin Benney is a 68 y.o. male who presents today for an office visit.  Assessment/Plan:  New/Acute Problems: Sinusitis  No red flags.  Likely due to viral URI however symptoms have been persistent for couple weeks.  He can continue over-the-counter meds.  Will start Astelin nasal spray.  Will send prescription in for azithromycin with instruction to not start unless symptoms fail to improve over the next several days.  Encouraged hydration.  Discussed reasons to return to care.  Follow-up as needed.  Chronic Problems Addressed Today: Prediabetes A1c improved to 5.7.  Congratulated patient on A1c reduction.  Doing well with metformin  500 mg daily.  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   Neck pain Doing much better after recent course of physical therapy.     Subjective:  HPI:  See assessment / plan for status of chronic conditions.   Discussed the use of AI scribe software for clinical note transcription with the patient, who gave verbal consent to proceed.  History of Present Illness Kiyan Burmester is a 68 year old male with diabetes who presents for follow-up of blood sugar control.  His A1c has improved from 6.6% to 5.7%, attributed to dietary changes such as cutting out candy. He continues to take metformin  with breakfast, initially causing diarrhea which has resolved.  He has experienced head congestion for the past couple of weeks, located above the throat, without sore throat but with some raspiness. He started taking cold medication yesterday, providing some relief.  He completed physical therapy for neck pain after his last visit, finding it helpful. He has not seen the therapist in about three weeks and has learned exercises to manage any soreness.         Objective:  Physical Exam: BP 110/62   Pulse (!) 54   Temp 98 F (36.7 C) (Temporal)   Ht 5' 10 (1.778 m)   Wt 173 lb 9.6 oz (78.7 kg)   SpO2 98%   BMI 24.91 kg/m   Wt Readings from Last 3 Encounters:   03/26/24 173 lb 9.6 oz (78.7 kg)  12/26/23 172 lb 6.4 oz (78.2 kg)  12/05/23 171 lb 12.8 oz (77.9 kg)    Gen: No acute distress, resting comfortably HEENT: TMs clear.  OP erythematous.  Nasal mucosa erythematous and boggy bilaterally with clear discharge. CV: Regular rate and rhythm with no murmurs appreciated Pulm: Normal work of breathing, clear to auscultation bilaterally with no crackles, wheezes, or rhonchi Neuro: Grossly normal, moves all extremities Psych: Normal affect and thought content      Joanthan Hlavacek M. Kennyth, MD 03/26/2024 8:01 AM

## 2024-03-26 NOTE — Patient Instructions (Signed)
 It was very nice to see you today!  VISIT SUMMARY: Today we reviewed your blood sugar control, discussed your recent head congestion, and followed up on your neck pain management.  YOUR PLAN: PREDIABETES: Your A1c has improved to 5.7%, indicating better blood sugar control. You are currently taking metformin  without significant side effects. -Continue taking metformin  as prescribed. -Maintain your current diet and exercise regimen. -We will recheck your A1c at your next physical in the summer.  ACUTE UPPER RESPIRATORY CONGESTION: You have been experiencing head congestion and a raspy voice, with some relief from over-the-counter medication. -Start using Astelin nasal spray as prescribed. -An antibiotic has been prescribed to use if your symptoms worsen.  Return for Annual Physical.   Take care, Dr Kennyth  PLEASE NOTE:  If you had any lab tests, please let us  know if you have not heard back within a few days. You may see your results on mychart before we have a chance to review them but we will give you a call once they are reviewed by us .   If we ordered any referrals today, please let us  know if you have not heard from their office within the next week.   If you had any urgent prescriptions sent in today, please check with the pharmacy within an hour of our visit to make sure the prescription was transmitted appropriately.   Please try these tips to maintain a healthy lifestyle:  Eat at least 3 REAL meals and 1-2 snacks per day.  Aim for no more than 5 hours between eating.  If you eat breakfast, please do so within one hour of getting up.   Each meal should contain half fruits/vegetables, one quarter protein, and one quarter carbs (no bigger than a computer mouse)  Cut down on sweet beverages. This includes juice, soda, and sweet tea.   Drink at least 1 glass of water  with each meal and aim for at least 8 glasses per day  Exercise at least 150 minutes every week.

## 2024-03-26 NOTE — Assessment & Plan Note (Signed)
 A1c improved to 5.7.  Congratulated patient on A1c reduction.  Doing well with metformin  500 mg daily.  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

## 2024-03-27 ENCOUNTER — Ambulatory Visit: Admitting: Family Medicine

## 2024-04-19 ENCOUNTER — Ambulatory Visit: Admitting: Psychiatry

## 2024-04-19 DIAGNOSIS — Z630 Problems in relationship with spouse or partner: Secondary | ICD-10-CM | POA: Diagnosis not present

## 2024-04-19 DIAGNOSIS — F4323 Adjustment disorder with mixed anxiety and depressed mood: Secondary | ICD-10-CM

## 2024-04-19 DIAGNOSIS — F3340 Major depressive disorder, recurrent, in remission, unspecified: Secondary | ICD-10-CM | POA: Diagnosis not present
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
## 2024-05-18 ENCOUNTER — Ambulatory Visit: Admitting: Psychiatry

## 2024-05-18 DIAGNOSIS — F4323 Adjustment disorder with mixed anxiety and depressed mood: Secondary | ICD-10-CM

## 2024-05-18 DIAGNOSIS — F3340 Major depressive disorder, recurrent, in remission, unspecified: Secondary | ICD-10-CM | POA: Diagnosis not present

## 2024-05-18 DIAGNOSIS — Z630 Problems in relationship with spouse or partner: Secondary | ICD-10-CM

## 2024-05-18 DIAGNOSIS — Z87898 Personal history of other specified conditions: Secondary | ICD-10-CM
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
## 2024-05-23 ENCOUNTER — Ambulatory Visit: Admitting: Dermatology

## 2024-05-23 ENCOUNTER — Encounter: Payer: Self-pay | Admitting: Dermatology

## 2024-05-23 VITALS — BP 135/85 | HR 56

## 2024-05-23 DIAGNOSIS — W908XXA Exposure to other nonionizing radiation, initial encounter: Secondary | ICD-10-CM | POA: Diagnosis not present

## 2024-05-23 DIAGNOSIS — L570 Actinic keratosis: Secondary | ICD-10-CM | POA: Diagnosis not present

## 2024-05-23 DIAGNOSIS — L578 Other skin changes due to chronic exposure to nonionizing radiation: Secondary | ICD-10-CM

## 2024-05-23 NOTE — Patient Instructions (Addendum)

## 2024-05-23 NOTE — Progress Notes (Signed)
 "  New Patient Visit   Subjective  Jackson [name] is a 69 y.o. male who presents for the following: Aks  Patient states he  has Aks located at the face that he  would like to have examined. Patient reports the areas have been there for 1 year. He reports the areas are not bothersome. Patient reports he  has not previously been treated for these areas. Patient reports throughout his lifetime he has had moderate sun exposure. Currently, patietn reports he does not apply sunscreen on a regular basis. Patient denies Hx of bx. Patient reports family history of skin cancer(s)(Mom).  Patient provided verbal consent for the use of an AI-assisted program to generate a detailed after-visit summary. The patient understands that the AI tool is used to support clinical documentation and that all information will be reviewed and verified by the healthcare provider.  The following portions of the chart were reviewed this encounter and updated as appropriate: medications, allergies, medical history  Review of Systems:  No other skin or systemic complaints except as noted in HPI or Assessment and Plan.  Objective  Well appearing patient in no apparent distress; mood and affect are within normal limits.  A focused examination was performed of the following areas: Face  Relevant exam findings are noted in the Assessment and Plan.            Assessment & Plan   ACTINIC KERATOSIS Exam: Erythematous thin papules/macules with gritty scale at the Face and Scalp  Actinic keratoses are precancerous spots that appear secondary to cumulative UV radiation exposure/sun exposure over time. They are chronic with expected duration over 1 year. A portion of actinic keratoses will progress to squamous cell carcinoma of the skin. It is not possible to reliably predict which spots will progress to skin cancer and so treatment is recommended to prevent development of skin cancer.  Actinic Damage: Recommend daily broad  spectrum sunscreen SPF 30+ to sun-exposed areas, reapply every 2 hours as needed.  Recommend staying in the shade or wearing long sleeves, sun glasses (UVA+UVB protection) and wide brim hats (4-inch brim around the entire circumference of the hat). Call for new or changing lesions.  Treatment Plan: - Plan to Start 5-fluorouracil cream twice a day for 14 days to affected areas including Face and Scalp next Fall/winter.  Reviewed course of treatment and expected reaction.  Patient advised to expect inflammation and crusting and advised that erosions are possible.  Patient advised to be diligent with sun protection during and after treatment. Handout with details of how to apply medication and what to expect provided. Counseled to keep medication out of reach of children and pets. - Cryo Therapy Completed while in office today, after care instructions provided - Plan to prescribe Triamcinolone 0.025% Ointment to apply 2 times daily for 2 weeks after Efudex Therapy AK (ACTINIC KERATOSIS) (13) Left Parotid Area, Left Preauricular Area, Left Zygomatic Area, Mid Parietal Scalp (5), Right Ear, Right Eyebrow, Right Forehead (2), Right Temple - Destruction of lesion - Left Parotid Area, Left Preauricular Area, Left Zygomatic Area, Mid Parietal Scalp (5), Right Ear, Right Eyebrow, Right Forehead (2), Right Temple Complexity: simple   Destruction method: cryotherapy   Informed consent: discussed and consent obtained   Timeout:  patient name, date of birth, surgical site, and procedure verified Lesion destroyed using liquid nitrogen: Yes   Cryotherapy cycles:  13 Post-procedure details: wound care instructions given     Return in about 9 months (around 02/20/2025) for  TBSE & AK F/U.  I, Jetta Ager, am acting as neurosurgeon for [employer], DO.  Documentation: I have reviewed the above documentation for accuracy and completeness, and I agree with the above.  [name] Lenis, DO     "

## 2024-06-19 ENCOUNTER — Ambulatory Visit: Admitting: Psychiatry

## 2024-06-19 DIAGNOSIS — F3340 Major depressive disorder, recurrent, in remission, unspecified: Secondary | ICD-10-CM

## 2024-06-19 DIAGNOSIS — F4323 Adjustment disorder with mixed anxiety and depressed mood: Secondary | ICD-10-CM

## 2024-06-19 DIAGNOSIS — Z630 Problems in relationship with spouse or partner: Secondary | ICD-10-CM
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
## 2024-07-17 ENCOUNTER — Ambulatory Visit: Admitting: Psychiatry

## 2024-08-21 ENCOUNTER — Ambulatory Visit: Admitting: Psychiatry

## 2024-09-17 ENCOUNTER — Ambulatory Visit: Admitting: Psychiatry

## 2024-10-18 ENCOUNTER — Ambulatory Visit

## 2024-12-06 ENCOUNTER — Encounter: Admitting: Family Medicine
